# Patient Record
Sex: Female | Born: 1985 | Race: Black or African American | Hispanic: No | Marital: Married | Smoking: Never smoker
Health system: Southern US, Community
[De-identification: ages and names within clinical notes are randomized; demographics above are authoritative.]

## PROBLEM LIST (undated history)

## (undated) ENCOUNTER — Inpatient Hospital Stay (HOSPITAL_COMMUNITY): Payer: Self-pay

## (undated) DIAGNOSIS — A599 Trichomoniasis, unspecified: Secondary | ICD-10-CM

## (undated) DIAGNOSIS — D219 Benign neoplasm of connective and other soft tissue, unspecified: Secondary | ICD-10-CM

## (undated) DIAGNOSIS — L309 Dermatitis, unspecified: Secondary | ICD-10-CM

## (undated) DIAGNOSIS — N83299 Other ovarian cyst, unspecified side: Secondary | ICD-10-CM

## (undated) DIAGNOSIS — D649 Anemia, unspecified: Secondary | ICD-10-CM

## (undated) DIAGNOSIS — N809 Endometriosis, unspecified: Secondary | ICD-10-CM

---

## 2007-11-13 HISTORY — PX: DENTAL SURGERY: SHX609

## 2015-12-23 ENCOUNTER — Encounter (HOSPITAL_COMMUNITY): Payer: Self-pay

## 2015-12-23 ENCOUNTER — Inpatient Hospital Stay (HOSPITAL_COMMUNITY)
Admission: EM | Admit: 2015-12-23 | Discharge: 2015-12-26 | DRG: 759 | Disposition: A | Discharge status: Home / Self Care | Payer: BC Managed Care – PPO | Attending: Obstetrics and Gynecology | Admitting: Obstetrics and Gynecology

## 2015-12-23 ENCOUNTER — Emergency Department (HOSPITAL_COMMUNITY): Payer: BC Managed Care – PPO

## 2015-12-23 DIAGNOSIS — N7093 Salpingitis and oophoritis, unspecified: Principal | ICD-10-CM | POA: Diagnosis present

## 2015-12-23 DIAGNOSIS — N83201 Unspecified ovarian cyst, right side: Secondary | ICD-10-CM | POA: Diagnosis present

## 2015-12-23 DIAGNOSIS — R1032 Left lower quadrant pain: Secondary | ICD-10-CM | POA: Diagnosis not present

## 2015-12-23 DIAGNOSIS — D649 Anemia, unspecified: Secondary | ICD-10-CM | POA: Diagnosis present

## 2015-12-23 DIAGNOSIS — D252 Subserosal leiomyoma of uterus: Secondary | ICD-10-CM | POA: Diagnosis present

## 2015-12-23 LAB — CBC WITH DIFFERENTIAL/PLATELET
Basophils Absolute: 0 10*3/uL (ref 0.0–0.1)
Basophils Relative: 0 %
Eosinophils Absolute: 0.1 10*3/uL (ref 0.0–0.7)
Eosinophils Relative: 0 %
HEMATOCRIT: 35.7 % — AB (ref 36.0–46.0)
HEMOGLOBIN: 12.2 g/dL (ref 12.0–15.0)
LYMPHS ABS: 2.7 10*3/uL (ref 0.7–4.0)
LYMPHS PCT: 14 %
MCH: 31.2 pg (ref 26.0–34.0)
MCHC: 34.2 g/dL (ref 30.0–36.0)
MCV: 91.3 fL (ref 78.0–100.0)
MONO ABS: 1.1 10*3/uL — AB (ref 0.1–1.0)
MONOS PCT: 6 %
NEUTROS ABS: 15.5 10*3/uL — AB (ref 1.7–7.7)
NEUTROS PCT: 80 %
Platelets: 307 10*3/uL (ref 150–400)
RBC: 3.91 MIL/uL (ref 3.87–5.11)
RDW: 12.4 % (ref 11.5–15.5)
WBC: 19.3 10*3/uL — ABNORMAL HIGH (ref 4.0–10.5)

## 2015-12-23 LAB — COMPREHENSIVE METABOLIC PANEL
ALBUMIN: 4.7 g/dL (ref 3.5–5.0)
ALK PHOS: 60 U/L (ref 38–126)
ALT: 12 U/L — ABNORMAL LOW (ref 14–54)
ANION GAP: 11 (ref 5–15)
AST: 19 U/L (ref 15–41)
BUN: 12 mg/dL (ref 6–20)
CHLORIDE: 99 mmol/L — AB (ref 101–111)
CO2: 25 mmol/L (ref 22–32)
Calcium: 9.8 mg/dL (ref 8.9–10.3)
Creatinine, Ser: 0.86 mg/dL (ref 0.44–1.00)
GFR calc non Af Amer: >60 mL/min (ref >60)
GLUCOSE: 91 mg/dL (ref 65–99)
POTASSIUM: 3.7 mmol/L (ref 3.5–5.1)
SODIUM: 135 mmol/L (ref 135–145)
Total Bilirubin: 1 mg/dL (ref 0.3–1.2)
Total Protein: 9.4 g/dL — ABNORMAL HIGH (ref 6.5–8.1)

## 2015-12-23 LAB — WET PREP, GENITAL
SPERM: NONE SEEN
TRICH WET PREP: NONE SEEN

## 2015-12-23 MED ORDER — SODIUM CHLORIDE 0.9 % IV BOLUS (SEPSIS)
1000.0000 mL | Freq: Once | INTRAVENOUS | Status: AC
  Administered 2015-12-23: 1000 mL via INTRAVENOUS

## 2015-12-23 MED ORDER — HYDROCODONE-ACETAMINOPHEN 5-325 MG PO TABS
1.0000 | ORAL_TABLET | Freq: Once | ORAL | Status: AC
  Administered 2015-12-23: 1 via ORAL
  Filled 2015-12-23: qty 1

## 2015-12-23 MED ORDER — IOHEXOL 300 MG/ML  SOLN
100.0000 mL | Freq: Once | INTRAMUSCULAR | Status: AC | PRN
  Administered 2015-12-23: 100 mL via INTRAVENOUS

## 2015-12-23 NOTE — ED Provider Notes (Signed)
CSN: [id number]     Arrival date & time 12/23/15  1524 History   First MD Initiated Contact with Patient 12/23/15 1657     Chief Complaint  Patient presents with  . Abdominal Pain     (Consider location/radiation/quality/duration/timing/severity/associated sxs/prior Treatment) Patient is a 30 y.o. female presenting with abdominal pain. The history is provided by the patient and medical records. No language interpreter was used.  Abdominal Pain Associated symptoms: vaginal bleeding (On menstrual cycle)   Associated symptoms: no chills, no constipation, no cough, no diarrhea, no dysuria, no fever, no nausea, no shortness of breath, no sore throat, no vaginal discharge and no vomiting    Ebony Valentine is a 30 y.o. female  with no pertinent PMH who was sent by urgent care to the Emergency Department complaining of throbbing, intermittent left lower abdominal pain since yesterday at 2 pm. Pain has remained the same over this time period. Aleve and ibuprofen taken with little relief. Worsens with food or drink, therefore patient has had decreased PO intake. Pt. Having daily BM's. On last day of menstrual cycle - states this pain is much different from her typical menstrual cramps. Patient denies n/v/d/constipation, no vaginal pain/discharge, no dysuria, no urinary frequency.   History reviewed. No pertinent past medical history. Past Surgical History  Procedure Laterality Date  . Dental surgery     Family History  Problem Relation Age of Onset  . Diabetes Father    Social History  Substance Use Topics  . Smoking status: Never Smoker   . Smokeless tobacco: Never Used  . Alcohol Use: Yes     Comment: wine once a wek   OB History    No data available     Review of Systems  Constitutional: Negative for fever and chills.  HENT: Negative for congestion and sore throat.   Eyes: Negative for visual disturbance.  Respiratory: Negative for cough and shortness of breath.   Cardiovascular:  Negative.   Gastrointestinal: Positive for abdominal pain. Negative for nausea, vomiting, diarrhea, constipation and blood in stool.  Genitourinary: Positive for vaginal bleeding (On menstrual cycle). Negative for dysuria, frequency, vaginal discharge and vaginal pain.  Musculoskeletal: Negative for back pain and neck pain.  Skin: Negative for rash.  Neurological: Negative for dizziness, weakness and headaches.      Allergies  Shellfish allergy  Home Medications   Prior to Admission medications   Medication Sig Start Date End Date Taking? Authorizing Provider  Cyanocobalamin (VITAMIN B12 PO) Place 1 tablet under the tongue daily.   Yes Historical Provider, MD  diphenhydrAMINE (BENADRYL) 25 MG tablet Take 25-50 mg by mouth daily as needed for allergies.   Yes Historical Provider, MD  Multiple Vitamin (MULTIVITAMIN WITH MINERALS) TABS tablet Take 2 tablets by mouth daily.   Yes Historical Provider, MD  naproxen sodium (ANAPROX) 220 MG tablet Take 440 mg by mouth 2 (two) times daily as needed (pain).   Yes Historical Provider, MD   BP 107/69 mmHg  Pulse 88  Temp(Src) 99.6 F (37.6 C) (Oral)  Resp 17  SpO2 100%  LMP 12/23/2015 Physical Exam  Constitutional: She is oriented to person, place, and time. She appears well-developed and well-nourished.  Alert and in no acute distress  HENT:  Head: Normocephalic and atraumatic.  Tachy mucus membranes. OP clear.   Cardiovascular: Normal rate, regular rhythm and normal heart sounds.   Pulmonary/Chest: Effort normal and breath sounds normal. No respiratory distress. She has no wheezes. She has no rales.  Abdominal: Soft. Bowel sounds are normal. She exhibits distension. She exhibits no mass. There is no rebound and no guarding.  Generalized abdominal tenderness, worst in LLQ.   Genitourinary: Vagina normal.  Mild left adnexal tenderness. No discharge.   Musculoskeletal: Normal range of motion. She exhibits no edema.  Neurological: She is  alert and oriented to person, place, and time.  Skin: Skin is warm and dry. No rash noted.  Psychiatric: She has a normal mood and affect. Her behavior is normal. Judgment and thought content normal.  Nursing note and vitals reviewed.   ED Course  Procedures (including critical care time) Labs Review Labs Reviewed  WET PREP, GENITAL - Abnormal; Notable for the following:    Yeast Wet Prep HPF POC RARE (*)    Clue Cells Wet Prep HPF POC FEW (*)    WBC, Wet Prep HPF POC FEW (*)    All other components within normal limits  COMPREHENSIVE METABOLIC PANEL - Abnormal; Notable for the following:    Chloride 99 (*)    Total Protein 9.4 (*)    ALT 12 (*)    All other components within normal limits  CBC WITH DIFFERENTIAL/PLATELET - Abnormal; Notable for the following:    WBC 19.3 (*)    HCT 35.7 (*)    Neutro Abs 15.5 (*)    Monocytes Absolute 1.1 (*)    All other components within normal limits  URINE CULTURE  PREGNANCY, URINE  URINALYSIS, ROUTINE W REFLEX MICROSCOPIC (NOT AT [facility])  GC/CHLAMYDIA PROBE AMP ([place]) NOT AT [facility]    Imaging Review US Transvaginal Non-ob  12/23/2015  CLINICAL DATA:  Acute onset of left lower quadrant abdominal pain and leukocytosis. Initial encounter. EXAM: TRANSABDOMINAL AND TRANSVAGINAL ULTRASOUND OF PELVIS TECHNIQUE: Both transabdominal and transvaginal ultrasound examinations of the pelvis were performed. Transabdominal technique was performed for global imaging of the pelvis including uterus, ovaries, adnexal regions, and pelvic cul-de-sac. It was necessary to proceed with endovaginal exam following the transabdominal exam to visualize the endometrium and ovaries in greater detail. COMPARISON:  CT the abdomen and pelvis performed earlier today at 7:50 p.m. FINDINGS: Uterus Measurements: 4.9 x 3.8 x 4.4 cm. There appears to be a subserosal 3.1 cm fibroid. Endometrium Thickness: 0.3 cm.  No focal abnormality visualized. Right ovary Measurements: 4.5 x  3.4 x 3.3 cm. A nonspecific mildly complex cystic focus is noted within the right ovary, measuring 1.9 cm. This may remain within normal limits. Left ovary Measurements: 3.5 x 3.1 x 3.0 cm. There is a large complex loculated left adnexal fluid containing structure, measuring 8.1 x 7.6 x 4.9 cm, with displacement of the uterus and right ovary. Surrounding mildly increased blood flow is noted. This is suspicious for pyosalpinx, though tubo-ovarian abscess cannot be entirely excluded. Other findings A small amount of free fluid is seen within the pelvic cul-de-sac. IMPRESSION: 1. Large complex loculated left adnexal fluid containing structure, measuring 8.1 x 7.6 x 4.9 cm, with displacement of the uterus and right ovary. This corresponds to the patient's left-sided symptoms and is suspicious for left-sided pyosalpinx, though a tubo-ovarian abscess cannot be entirely excluded. Surrounding hyperemia noted. 2. Nonspecific mildly complex 1.9 cm cystic focus within the right ovary may remain within normal limits. 3. Subserosal 3.1 cm fibroid. Uterus otherwise grossly unremarkable. 4. No evidence for ovarian torsion. Visualized left ovarian tissue is unremarkable. These results were called by telephone at the time of interpretation on 12/23/2015 at 11:39 pm to [facility] PA, who verbally  acknowledged these results. Electronically Signed   By: Garald Balding M.D.   On: 12/23/2015 23:41   US Pelvis Complete  12/23/2015  CLINICAL DATA:  Acute onset of left lower quadrant abdominal pain and leukocytosis. Initial encounter. EXAM: TRANSABDOMINAL AND TRANSVAGINAL ULTRASOUND OF PELVIS TECHNIQUE: Both transabdominal and transvaginal ultrasound examinations of the pelvis were performed. Transabdominal technique was performed for global imaging of the pelvis including uterus, ovaries, adnexal regions, and pelvic cul-de-sac. It was necessary to proceed with endovaginal exam following the transabdominal exam to visualize the  endometrium and ovaries in greater detail. COMPARISON:  CT the abdomen and pelvis performed earlier today at 7:50 p.m. FINDINGS: Uterus Measurements: 4.9 x 3.8 x 4.4 cm. There appears to be a subserosal 3.1 cm fibroid. Endometrium Thickness: 0.3 cm.  No focal abnormality visualized. Right ovary Measurements: 4.5 x 3.4 x 3.3 cm. A nonspecific mildly complex cystic focus is noted within the right ovary, measuring 1.9 cm. This may remain within normal limits. Left ovary Measurements: 3.5 x 3.1 x 3.0 cm. There is a large complex loculated left adnexal fluid containing structure, measuring 8.1 x 7.6 x 4.9 cm, with displacement of the uterus and right ovary. Surrounding mildly increased blood flow is noted. This is suspicious for pyosalpinx, though tubo-ovarian abscess cannot be entirely excluded. Other findings A small amount of free fluid is seen within the pelvic cul-de-sac. IMPRESSION: 1. Large complex loculated left adnexal fluid containing structure, measuring 8.1 x 7.6 x 4.9 cm, with displacement of the uterus and right ovary. This corresponds to the patient's left-sided symptoms and is suspicious for left-sided pyosalpinx, though a tubo-ovarian abscess cannot be entirely excluded. Surrounding hyperemia noted. 2. Nonspecific mildly complex 1.9 cm cystic focus within the right ovary may remain within normal limits. 3. Subserosal 3.1 cm fibroid. Uterus otherwise grossly unremarkable. 4. No evidence for ovarian torsion. Visualized left ovarian tissue is unremarkable. These results were called by telephone at the time of interpretation on 12/23/2015 at 11:39 pm to [facility] PA, who verbally acknowledged these results. Electronically Signed   By: Garald Balding M.D.   On: 12/23/2015 23:41   Ct Abdomen Pelvis W Contrast  12/23/2015  CLINICAL DATA:  30 year old female with left lower quadrant abdominal pain EXAM: CT ABDOMEN AND PELVIS WITH CONTRAST TECHNIQUE: Multidetector CT imaging of the abdomen and pelvis was  performed using the standard protocol following bolus administration of intravenous contrast. CONTRAST:  155mL OMNIPAQUE IOHEXOL 300 MG/ML  SOLN COMPARISON:  None. FINDINGS: The visualized lung bases are clear. No intra-abdominal free air. Small free fluid within the pelvis. The liver, gallbladder, pancreas, spleen, adrenal glands kidneys, visualized ureters, and urinary bladder appear unremarkable. There is a 3.1 x 3.1 cm uterine fundal fibroid. There are dilated tubular appearing structures in the region of the left adnexa as well as in the posterior pelvis/ cul-de-sac compatible with bilateral hydrosalpinx, and left greater right. The left adnexal tube measure up to 4.7 cm in diameter. Clinical correlation is recommended to evaluate for pelvic inflammatory disease. Further evaluation with pelvic ultrasound is recommended. There is mild apparent thickening and inflammatory changes of the distal small bowel loop, likely secondary to inflammatory changes of the pelvis. Oral contrast traverses into the colon without evidence of bowel obstruction. Normal appendix. The abdominal aorta and IVC appear unremarkable. No portal venous gas identified. There is no adenopathy. Small fat containing umbilical hernia. The osseous structures appear unremarkable. IMPRESSION: Bilateral hydrosalpinx, left greater than right. Correlation with clinical exam is recommended to evaluate  for pelvic inflammatory disease. Ultrasound recommended for further evaluation. Thickened appearing distal small bowel, likely reactive to inflammatory changes of the pelvis. Enteritis is less likely. No bowel obstruction. Electronically Signed   By: Anner Crete M.D.   On: 12/23/2015 20:31   I have personally reviewed and evaluated these images and lab results as part of my medical decision-making.   EKG Interpretation None      MDM   Final diagnoses:  Left lower quadrant pain   Ashelly Zinsmeister presents with left lower quadrant pain.  TTP of bilateral lower abdomen, L>R. Pelvic with mild left-sided adnexal tenderness, no discharge.   Labs: CBC with white count of 19.3, ab neuto 15.5; CMP reassuring. Wet prep with few clue cells, few WBC, rare yeast.   Ultrasound shows large complex loculated left adnexal fluid containing structure suspicious for pyosalpinx, however cannot rule out TOA.   Consults: OBGYN, Dr. Nehemiah Settle who recommends starting ABX in ED and sending patient to [facility] for further evaluation and management  Therapeutics: Pain control while in ED, IV fluids  A&P: Patient to be transferred to MAU for further evaluation and management. Accepting physician Dr. Nehemiah Settle. Of note, UA and culture were ordered on this patient. I was informed after consultation that lab specimen had been lost. Patient to get UA at accepting facility.   Patient discussed with Dr. Zenia Resides who agrees with treatment plan.   [facility] Sorren Vallier, PA-C 12/24/15 0136  Lacretia Leigh, MD 12/25/15 [phone]

## 2015-12-23 NOTE — Progress Notes (Signed)
EDCM spoke to patient at bedside.  Patient reports she sees her obgyn Dr. Henry Russel.  Patient is without a pcp.  [facility] provided patient with list of pcps who accept [employer] insurance within a 10 mile radius of her zip code [phone].  Patient thankful for services.  No further EDCM needs at this time.

## 2015-12-23 NOTE — ED Notes (Addendum)
Patient c/o left lower abdominal pain since yesterday. Patient states afater she started her menstrual period , she experienced pain left lower abdomen. Patient denies N/VD, dysuria, or vaginal drainage. Patient went to UC today and was given Tylenol with some relief and sent for further evaluation.

## 2015-12-24 ENCOUNTER — Encounter (HOSPITAL_COMMUNITY): Payer: Self-pay

## 2015-12-24 DIAGNOSIS — N7093 Salpingitis and oophoritis, unspecified: Secondary | ICD-10-CM | POA: Diagnosis present

## 2015-12-24 DIAGNOSIS — D252 Subserosal leiomyoma of uterus: Secondary | ICD-10-CM | POA: Diagnosis present

## 2015-12-24 DIAGNOSIS — N83201 Unspecified ovarian cyst, right side: Secondary | ICD-10-CM | POA: Diagnosis present

## 2015-12-24 DIAGNOSIS — R1032 Left lower quadrant pain: Secondary | ICD-10-CM | POA: Diagnosis present

## 2015-12-24 DIAGNOSIS — D649 Anemia, unspecified: Secondary | ICD-10-CM | POA: Diagnosis present

## 2015-12-24 LAB — URINE MICROSCOPIC-ADD ON

## 2015-12-24 LAB — URINALYSIS, ROUTINE W REFLEX MICROSCOPIC
Bilirubin Urine: NEGATIVE
GLUCOSE, UA: NEGATIVE mg/dL
Ketones, ur: >80 mg/dL — AB
LEUKOCYTES UA: NEGATIVE
Nitrite: NEGATIVE
PROTEIN: NEGATIVE mg/dL
SPECIFIC GRAVITY, URINE: 1.025 (ref 1.005–1.030)
pH: 6 (ref 5.0–8.0)

## 2015-12-24 LAB — CBC
HEMATOCRIT: 29.7 % — AB (ref 36.0–46.0)
HEMOGLOBIN: 10 g/dL — AB (ref 12.0–15.0)
MCH: 30.6 pg (ref 26.0–34.0)
MCHC: 33.7 g/dL (ref 30.0–36.0)
MCV: 90.8 fL (ref 78.0–100.0)
Platelets: 250 10*3/uL (ref 150–400)
RBC: 3.27 MIL/uL — ABNORMAL LOW (ref 3.87–5.11)
RDW: 12.8 % (ref 11.5–15.5)
WBC: 15.4 10*3/uL — AB (ref 4.0–10.5)

## 2015-12-24 LAB — PREGNANCY, URINE: PREG TEST UR: NEGATIVE

## 2015-12-24 MED ORDER — PRENATAL MULTIVITAMIN CH
1.0000 | ORAL_TABLET | Freq: Every day | ORAL | Status: DC
  Administered 2015-12-24 – 2015-12-26 (×3): 1 via ORAL
  Filled 2015-12-24 (×3): qty 1

## 2015-12-24 MED ORDER — SODIUM CHLORIDE 0.9 % IV SOLN
INTRAVENOUS | Status: DC
  Administered 2015-12-24 – 2015-12-25 (×3): via INTRAVENOUS

## 2015-12-24 MED ORDER — CALCIUM CARBONATE ANTACID 500 MG PO CHEW: 2.0000 | CHEWABLE_TABLET | ORAL | Status: DC | PRN

## 2015-12-24 MED ORDER — ALUM & MAG HYDROXIDE-SIMETH 200-200-20 MG/5ML PO SUSP: 30.0000 mL | Freq: Four times a day (QID) | ORAL | Status: DC | PRN

## 2015-12-24 MED ORDER — DOCUSATE SODIUM 100 MG PO CAPS
100.0000 mg | ORAL_CAPSULE | Freq: Every day | ORAL | Status: DC
  Administered 2015-12-24 – 2015-12-26 (×3): 100 mg via ORAL
  Filled 2015-12-24 (×3): qty 1

## 2015-12-24 MED ORDER — ONDANSETRON HCL 4 MG/2ML IJ SOLN
4.0000 mg | Freq: Four times a day (QID) | INTRAMUSCULAR | Status: DC | PRN
Start: 2015-12-24 — End: 2015-12-26

## 2015-12-24 MED ORDER — ACETAMINOPHEN 325 MG PO TABS: 650.0000 mg | ORAL_TABLET | Freq: Four times a day (QID) | ORAL | Status: DC | PRN

## 2015-12-24 MED ORDER — ACETAMINOPHEN 650 MG RE SUPP: 650.0000 mg | Freq: Four times a day (QID) | RECTAL | Status: DC | PRN

## 2015-12-24 MED ORDER — DEXTROSE 5 % IV SOLN
2.0000 g | Freq: Two times a day (BID) | INTRAVENOUS | Status: DC
  Administered 2015-12-24: 2 g via INTRAVENOUS
  Filled 2015-12-24 (×2): qty 2

## 2015-12-24 MED ORDER — DOXYCYCLINE HYCLATE 100 MG IV SOLR
100.0000 mg | Freq: Two times a day (BID) | INTRAVENOUS | Status: DC
  Administered 2015-12-24: 100 mg via INTRAVENOUS
  Filled 2015-12-24 (×2): qty 100

## 2015-12-24 MED ORDER — DOXYCYCLINE HYCLATE 100 MG IV SOLR
100.0000 mg | Freq: Two times a day (BID) | INTRAVENOUS | Status: DC
  Administered 2015-12-24 – 2015-12-26 (×5): 100 mg via INTRAVENOUS
  Filled 2015-12-24 (×5): qty 100

## 2015-12-24 MED ORDER — SODIUM CHLORIDE 0.9 % IV SOLN
INTRAVENOUS | Status: DC
  Administered 2015-12-24: 03:00:00 via INTRAVENOUS

## 2015-12-24 MED ORDER — NAPROXEN SODIUM 275 MG PO TABS
440.0000 mg | ORAL_TABLET | Freq: Two times a day (BID) | ORAL | Status: DC | PRN
  Filled 2015-12-24: qty 2

## 2015-12-24 MED ORDER — ACETAMINOPHEN 325 MG PO TABS: 650.0000 mg | ORAL_TABLET | ORAL | Status: DC | PRN

## 2015-12-24 MED ORDER — DEXTROSE 5 % IV SOLN
2.0000 g | Freq: Two times a day (BID) | INTRAVENOUS | Status: DC
  Administered 2015-12-24 – 2015-12-25 (×4): 2 g via INTRAVENOUS
  Filled 2015-12-24 (×5): qty 2

## 2015-12-24 MED ORDER — HYDROCODONE-ACETAMINOPHEN 5-325 MG PO TABS
1.0000 | ORAL_TABLET | ORAL | Status: DC | PRN
  Administered 2015-12-24 – 2015-12-26 (×5): 1 via ORAL
  Filled 2015-12-24 (×5): qty 1

## 2015-12-24 NOTE — Progress Notes (Signed)
TOA  Day# 1 cefotetan / doxycycline #0  Subjective:  Has remained afebrile Patient reports that pain is well managed.  Tolerating normal  diet without difficulty. No nausea / vomiting.  Ambulating and voiding. Passing flatus  Objective: BP 102/59 mmHg  Pulse 86  Temp(Src) 99.1 F (37.3 C) (Oral)  Resp 18  Ht 5\' 6"  (1.676 m)  Wt 153 lb (69.4 kg)  BMI 24.71 kg/m2  SpO2 100%  LMP 12/23/2015 Abdomen:slightly distended with mild guarding and tenderness LLQ. No rebound Extremities: Homans sign is negative, no sign of DVT   WBC down to 15.4 from 19.2  Assessment: 8 cm TOA   Plan: IV antibiotics for 48 hours and then consider outpatient management  LOS: 0 days    Lillard Bailon A 12/24/2015, 2:06 PM

## 2015-12-24 NOTE — H&P (Signed)
Faculty Practice History and Physical  Ebony Valentine [id number] DOB: [birth year] DOA: 12/23/2015  Chief Complaint: Left lower quadrant pain  HPI: Ebony Valentine is a 30 y.o. female with a non-complicated medical history who presents to the [facility] emergency department with severe, nonradiating left-sided lower quadrant pain that started 3 days ago and has gradually become worse. Worse with movement and improved with rest. Currently, after Vicodin, her pain is 2 out of 10. The patient regularly sees [facility] for obstetrical care   Review of Systems:   Pt complains of cold chills last night and moderate decrease in appetite.  Pt denies any fevers, nausea, vomiting, chest pain, shortness of breath, orthopnea, dyspnea on exertion, headache, blurred vision, vaginal bleeding, vaginal discharge, dysuria, frequency. Review of systems are otherwise negative  Prenatal History/Complications: None  Past Medical History: History reviewed. No pertinent past medical history.  Past Surgical History: Past Surgical History  Procedure Laterality Date  . Dental surgery      Obstetrical History: OB History    No data available      Social History: Social History   Social History  . Marital Status: Married    Spouse Name: N/A  . Number of Children: N/A  . Years of Education: N/A   Social History Main Topics  . Smoking status: Never Smoker   . Smokeless tobacco: Never Used  . Alcohol Use: Yes     Comment: wine once a wek  . Drug Use: No  . Sexual Activity: Not Asked   Other Topics Concern  . None   Social History Narrative    Family History: Family History  Problem Relation Age of Onset  . Diabetes Father     Allergies: Allergies  Allergen Reactions  . Shellfish Allergy Anaphylaxis    Prescriptions prior to admission  Medication Sig Dispense Refill Last Dose  . Cyanocobalamin (VITAMIN B12 PO) Place 1 tablet under the tongue daily.   Past Week at  Unknown time  . diphenhydrAMINE (BENADRYL) 25 MG tablet Take 25-50 mg by mouth daily as needed for allergies.   Past Month at Unknown time  . Multiple Vitamin (MULTIVITAMIN WITH MINERALS) TABS tablet Take 2 tablets by mouth daily.   12/23/2015 at Unknown time  . naproxen sodium (ANAPROX) 220 MG tablet Take 440 mg by mouth 2 (two) times daily as needed (pain).   Past Week at Unknown time    Physical Exam: BP 107/56 mmHg  Pulse 85  Temp(Src) 98.3 F (36.8 C) (Oral)  Resp 18  Ht 5\' 6"  (1.676 m)  Wt 153 lb (69.4 kg)  BMI 24.71 kg/m2  SpO2 100%  LMP 12/23/2015  BP 107/56 mmHg  Pulse 85  Temp(Src) 98.3 F (36.8 C) (Oral)  Resp 18  Ht 5\' 6"  (1.676 m)  Wt 153 lb (69.4 kg)  BMI 24.71 kg/m2  SpO2 100%  LMP 12/23/2015 General appearance: alert, cooperative and no distress Head: Normocephalic, without obvious abnormality, atraumatic Eyes: conjunctivae/corneas clear. PERRL, EOM's intact. Fundi benign. Ears: External ears normal bilaterally Neck: no adenopathy, no carotid bruit, no JVD, supple, symmetrical, trachea midline and thyroid not enlarged, symmetric, no tenderness/mass/nodules Lungs: clear to auscultation bilaterally Heart: regular rate and rhythm, S1, S2 normal, no murmur, click, rub or gallop Abdomen: Soft, tender in the lower quadrants left greater than right with mild guarding. No rebound tenderness. Hypoactive bowel sounds. Extremities: extremities normal, atraumatic, no cyanosis or edema Pulses: 2+ and symmetric Skin: Skin color, texture, turgor normal. No rashes or lesions  Neurologic: Alert and oriented X 3, normal strength and tone. Normal symmetric reflexes. Normal coordination and gait             Labs on Admission:  Basic Metabolic Panel:  Recent Labs Lab 12/23/15 1728  NA 135  K 3.7  CL 99*  CO2 25  GLUCOSE 91  BUN 12  CREATININE 0.86  CALCIUM 9.8   Liver Function Tests:  Recent Labs Lab 12/23/15 1728  AST 19  ALT 12*  ALKPHOS 60  BILITOT 1.0   PROT 9.4*  ALBUMIN 4.7   No results for input(s): LIPASE, AMYLASE in the last 168 hours. No results for input(s): AMMONIA in the last 168 hours. CBC:  Recent Labs Lab 12/23/15 1728 12/24/15 0538  WBC 19.3* 15.4*  NEUTROABS 15.5*  --   HGB 12.2 10.0*  HCT 35.7* 29.7*  MCV 91.3 90.8  PLT 307 250    CBG: No results for input(s): GLUCAP in the last 168 hours.  Radiological Exams on Admission: US Transvaginal Non-ob  12/23/2015  CLINICAL DATA:  Acute onset of left lower quadrant abdominal pain and leukocytosis. Initial encounter. EXAM: TRANSABDOMINAL AND TRANSVAGINAL ULTRASOUND OF PELVIS TECHNIQUE: Both transabdominal and transvaginal ultrasound examinations of the pelvis were performed. Transabdominal technique was performed for global imaging of the pelvis including uterus, ovaries, adnexal regions, and pelvic cul-de-sac. It was necessary to proceed with endovaginal exam following the transabdominal exam to visualize the endometrium and ovaries in greater detail. COMPARISON:  CT the abdomen and pelvis performed earlier today at 7:50 p.m. FINDINGS: Uterus Measurements: 4.9 x 3.8 x 4.4 cm. There appears to be a subserosal 3.1 cm fibroid. Endometrium Thickness: 0.3 cm.  No focal abnormality visualized. Right ovary Measurements: 4.5 x 3.4 x 3.3 cm. A nonspecific mildly complex cystic focus is noted within the right ovary, measuring 1.9 cm. This may remain within normal limits. Left ovary Measurements: 3.5 x 3.1 x 3.0 cm. There is a large complex loculated left adnexal fluid containing structure, measuring 8.1 x 7.6 x 4.9 cm, with displacement of the uterus and right ovary. Surrounding mildly increased blood flow is noted. This is suspicious for pyosalpinx, though tubo-ovarian abscess cannot be entirely excluded. Other findings A small amount of free fluid is seen within the pelvic cul-de-sac. IMPRESSION: 1. Large complex loculated left adnexal fluid containing structure, measuring 8.1 x 7.6 x 4.9  cm, with displacement of the uterus and right ovary. This corresponds to the patient's left-sided symptoms and is suspicious for left-sided pyosalpinx, though a tubo-ovarian abscess cannot be entirely excluded. Surrounding hyperemia noted. 2. Nonspecific mildly complex 1.9 cm cystic focus within the right ovary may remain within normal limits. 3. Subserosal 3.1 cm fibroid. Uterus otherwise grossly unremarkable. 4. No evidence for ovarian torsion. Visualized left ovarian tissue is unremarkable. These results were called by telephone at the time of interpretation on 12/23/2015 at 11:39 pm to [facility] PA, who verbally acknowledged these results. Electronically Signed   By: Garald Balding M.D.   On: 12/23/2015 23:41   US Pelvis Complete  12/23/2015  CLINICAL DATA:  Acute onset of left lower quadrant abdominal pain and leukocytosis. Initial encounter. EXAM: TRANSABDOMINAL AND TRANSVAGINAL ULTRASOUND OF PELVIS TECHNIQUE: Both transabdominal and transvaginal ultrasound examinations of the pelvis were performed. Transabdominal technique was performed for global imaging of the pelvis including uterus, ovaries, adnexal regions, and pelvic cul-de-sac. It was necessary to proceed with endovaginal exam following the transabdominal exam to visualize the endometrium and ovaries in greater detail. COMPARISON:  CT the abdomen and pelvis performed earlier today at 7:50 p.m. FINDINGS: Uterus Measurements: 4.9 x 3.8 x 4.4 cm. There appears to be a subserosal 3.1 cm fibroid. Endometrium Thickness: 0.3 cm.  No focal abnormality visualized. Right ovary Measurements: 4.5 x 3.4 x 3.3 cm. A nonspecific mildly complex cystic focus is noted within the right ovary, measuring 1.9 cm. This may remain within normal limits. Left ovary Measurements: 3.5 x 3.1 x 3.0 cm. There is a large complex loculated left adnexal fluid containing structure, measuring 8.1 x 7.6 x 4.9 cm, with displacement of the uterus and right ovary. Surrounding mildly  increased blood flow is noted. This is suspicious for pyosalpinx, though tubo-ovarian abscess cannot be entirely excluded. Other findings A small amount of free fluid is seen within the pelvic cul-de-sac. IMPRESSION: 1. Large complex loculated left adnexal fluid containing structure, measuring 8.1 x 7.6 x 4.9 cm, with displacement of the uterus and right ovary. This corresponds to the patient's left-sided symptoms and is suspicious for left-sided pyosalpinx, though a tubo-ovarian abscess cannot be entirely excluded. Surrounding hyperemia noted. 2. Nonspecific mildly complex 1.9 cm cystic focus within the right ovary may remain within normal limits. 3. Subserosal 3.1 cm fibroid. Uterus otherwise grossly unremarkable. 4. No evidence for ovarian torsion. Visualized left ovarian tissue is unremarkable. These results were called by telephone at the time of interpretation on 12/23/2015 at 11:39 pm to [facility] PA, who verbally acknowledged these results. Electronically Signed   By: Garald Balding M.D.   On: 12/23/2015 23:41   Ct Abdomen Pelvis W Contrast  12/23/2015  CLINICAL DATA:  30 year old female with left lower quadrant abdominal pain EXAM: CT ABDOMEN AND PELVIS WITH CONTRAST TECHNIQUE: Multidetector CT imaging of the abdomen and pelvis was performed using the standard protocol following bolus administration of intravenous contrast. CONTRAST:  168mL OMNIPAQUE IOHEXOL 300 MG/ML  SOLN COMPARISON:  None. FINDINGS: The visualized lung bases are clear. No intra-abdominal free air. Small free fluid within the pelvis. The liver, gallbladder, pancreas, spleen, adrenal glands kidneys, visualized ureters, and urinary bladder appear unremarkable. There is a 3.1 x 3.1 cm uterine fundal fibroid. There are dilated tubular appearing structures in the region of the left adnexa as well as in the posterior pelvis/ cul-de-sac compatible with bilateral hydrosalpinx, and left greater right. The left adnexal tube measure up to 4.7 cm  in diameter. Clinical correlation is recommended to evaluate for pelvic inflammatory disease. Further evaluation with pelvic ultrasound is recommended. There is mild apparent thickening and inflammatory changes of the distal small bowel loop, likely secondary to inflammatory changes of the pelvis. Oral contrast traverses into the colon without evidence of bowel obstruction. Normal appendix. The abdominal aorta and IVC appear unremarkable. No portal venous gas identified. There is no adenopathy. Small fat containing umbilical hernia. The osseous structures appear unremarkable. IMPRESSION: Bilateral hydrosalpinx, left greater than right. Correlation with clinical exam is recommended to evaluate for pelvic inflammatory disease. Ultrasound recommended for further evaluation. Thickened appearing distal small bowel, likely reactive to inflammatory changes of the pelvis. Enteritis is less likely. No bowel obstruction. Electronically Signed   By: Anner Crete M.D.   On: 12/23/2015 20:31     Assessment/Plan Present on Admission:  . TOA (tubo-ovarian abscess)  #1 tubo-ovarian abscess versus pyosalpinx  Admit  Cefotetan and doxycycline  Repeat white count this morning shows slight improvement. Will repeat tomorrow and trend.  Maternal remainder care to [facility]  Code Status: Full code  Disposition Plan: Estimated 2-3 day stay  for IV antibiotics   Truett Mainland, DO 12/24/2015 6:42 AM Faculty Practice Attending Physician [facility] Attending Phone #: [phone]

## 2015-12-25 LAB — CBC WITH DIFFERENTIAL/PLATELET
BASOS ABS: 0 10*3/uL (ref 0.0–0.1)
Basophils Relative: 0 %
Eosinophils Absolute: 0.3 10*3/uL (ref 0.0–0.7)
Eosinophils Relative: 3 %
HEMATOCRIT: 27.4 % — AB (ref 36.0–46.0)
Hemoglobin: 9.4 g/dL — ABNORMAL LOW (ref 12.0–15.0)
LYMPHS ABS: 2.3 10*3/uL (ref 0.7–4.0)
LYMPHS PCT: 22 %
MCH: 30.9 pg (ref 26.0–34.0)
MCHC: 34.3 g/dL (ref 30.0–36.0)
MCV: 90.1 fL (ref 78.0–100.0)
Monocytes Absolute: 0.4 10*3/uL (ref 0.1–1.0)
Monocytes Relative: 4 %
NEUTROS ABS: 7.6 10*3/uL (ref 1.7–7.7)
Neutrophils Relative %: 71 %
Platelets: 265 10*3/uL (ref 150–400)
RBC: 3.04 MIL/uL — AB (ref 3.87–5.11)
RDW: 12.8 % (ref 11.5–15.5)
WBC: 10.6 10*3/uL — AB (ref 4.0–10.5)

## 2015-12-25 LAB — URINE CULTURE

## 2015-12-25 NOTE — Progress Notes (Signed)
*   No surgery found *  Subjective: Patient reports tolerating PO, + flatus and + BM.    Objective: I have reviewed patient's vital signs and medications.  General: alert and cooperative Resp: clear to auscultation bilaterally Cardio: regular rate and rhythm, S1, S2 normal, no murmur, click, rub or gallop GI: soft, non-tender; bowel sounds normal; no masses,  no organomegaly Extremities: extremities normal, atraumatic, no cyanosis or edema Vaginal Bleeding: none  Assessment: Pt with TOA possible endometrioma  Plan: Continue abx  Pt takes pain meds prn  LOS: 1 day    Ebony Valentine A 12/25/2015, 1:49 PM

## 2015-12-26 LAB — GC/CHLAMYDIA PROBE AMP (~~LOC~~) NOT AT ARMC
Chlamydia: NEGATIVE
Neisseria Gonorrhea: NEGATIVE

## 2015-12-26 MED ORDER — TERCONAZOLE 0.4 % VA CREA: TOPICAL_CREAM | VAGINAL | Status: DC

## 2015-12-26 MED ORDER — DOXYCYCLINE HYCLATE 100 MG PO TABS: 100.0000 mg | ORAL_TABLET | Freq: Two times a day (BID) | ORAL | Status: DC

## 2015-12-26 MED ORDER — FLUCONAZOLE 150 MG PO TABS: 150.0000 mg | ORAL_TABLET | Freq: Once | ORAL | Status: DC

## 2015-12-26 MED ORDER — HYDROCODONE-ACETAMINOPHEN 5-325 MG PO TABS: 1.0000 | ORAL_TABLET | ORAL | Status: DC | PRN

## 2015-12-26 MED ORDER — CIPROFLOXACIN HCL 500 MG PO TABS: 500.0000 mg | ORAL_TABLET | Freq: Two times a day (BID) | ORAL | Status: DC

## 2015-12-26 MED ORDER — NAPROXEN SODIUM ER 500 MG PO TB24: ORAL_TABLET | ORAL | Status: DC

## 2015-12-26 NOTE — Discharge Instructions (Signed)
Call [facility] @ [phone] if:  You have a temperature greater than or equal to 100.4 degrees Farenheit orally You have pain that is not made better by the pain medication given and taken as directed You have excessive bleeding or problems urinating  Take Colace (Docusate Sodium/Stool Softener) 100 mg 2-3 times daily while taking narcotic pain medicine to avoid constipation or until bowel movements are regular. Take iron supplement of your choice twice a day for the next 12 weeks Take Naproxen Sodium 500 mg ER   twice a day  (at least 8 hours apart with food for pain  You may drive when you are no longer taking narcotic for pain You may walk up steps You may shower   You may resume a regular diet Avoid anything in vagina for 6 weeks (or until after your post-operative visit)

## 2015-12-26 NOTE — Discharge Summary (Signed)
Physician Discharge Summary  Patient ID: Ebony Valentine MRN: [id number] DOB/AGE: Nov 19, 1985 30 y.o.  Admit date: 12/23/2015 Discharge date: 12/26/2015   Discharge Diagnoses: Abdominal Pain, Tubo-ovarian Abscess, Right Ovarian Cyst and Uterine Fibroid Active Problems:   TOA (tubo-ovarian abscess)    Discharged Condition: Good  Hospital Course: The patient was  transferred from [facility] Emergency Department 12/24/15,  after being evaluated for left lower quadrant pelvic pain of 1 day duration.   Pelvic ultrasound revealed that the patient had a large complex loculated left adnexal fluid containing structure measuring 8.1 x 7.6 x 4.9 cm, a complex right ovarian cyst measuring 1.9 cm and a 3.1 cm subserosal fibroid.  Though afebrile throughout her visit her white blood count on admission to the ER was 19.3 but,  after  48 hours, it  decreased to 10.6.  Discharge hemoglobin/hematocrit were 9.4/27.4 respectively.  After 48 hours of IV cefotetan and doxycycline the patient  was virtually pain free, was ambulatory and had no bowel or bladder complaints.  Having received the maximum benefit of her hospital stay the patient was therefore deemed ready for discharge home.   Disposition:  Home to self care  Discharge Medications:    Medication List    STOP taking these medications        naproxen sodium 220 MG tablet  Commonly known as:  ANAPROX  Replaced by:  naproxen 500 MG 24 hr tablet      TAKE these medications        diphenhydrAMINE 25 MG tablet  Commonly known as:  BENADRYL  Take 25-50 mg by mouth daily as needed for allergies.     HYDROcodone-acetaminophen 5-325 MG tablet  Commonly known as:  NORCO/VICODIN  Take 1 tablet by mouth every 4 (four) hours as needed for moderate pain.     multivitamin with minerals Tabs tablet  Take 2 tablets by mouth daily.     naproxen 500 MG 24 hr tablet  Commonly known as:  NAPRELAN  1   po  pc bid  (at least 8 hours apart) x 3 days then prn-pain     VITAMIN B12 PO  Place 1 tablet under the tongue daily.          Follow-up: Dr. Katharine Look A. Rivard on January 17, 2016 at 11:15 a.m. (preceded by a follow up ultrasound at 11 a.m.)   Signed: Earnstine Regal, PA-C 12/26/2015, 8:52 AM

## 2015-12-26 NOTE — Progress Notes (Signed)
Patient discharged home with sister... Discharge instructions reviewed with patient and she verbalized understanding... Condition stable... No equipment... Ambulated to car with Santiago Bur, NT.

## 2015-12-26 NOTE — Progress Notes (Signed)
Ebony Valentine is a30 y.o.  [id number]  Hospital Day # 2:  TOA  Subjective: Patient is doing well with no complaints of pain.  Is voiding and having a bowel movement without difficulty. Patient has no problems ambulating in the halls and is tolerating a regular diet.   Objective: Vital signs in last 24 hours: Temp:  [98 F (36.7 C)-99.3 F (37.4 C)] 98.3 F (36.8 C) (02/13 0556) Pulse Rate:  [72-97] 72 (02/13 0556) Resp:  [16-18] 16 (02/13 0556) BP: (97-126)/(67-74) 97/67 mmHg (02/13 0556) SpO2:  [100 %] 100 % (02/13 0556)  Intake/Output from previous day: 02/12 0701 - 02/13 0700 In: 2393.3 [P.O.:220; I.V.:1623.3] Out: 2000 [Urine:2000] Intake/Output this shift:    Recent Labs Lab 12/23/15 1728 12/24/15 0538 12/25/15 0813  WBC 19.3* 15.4* 10.6*  HGB 12.2 10.0* 9.4*  HCT 35.7* 29.7* 27.4*  PLT 307 250 265     Recent Labs Lab 12/23/15 1728  NA 135  K 3.7  CL 99*  CO2 25  BUN 12  CREATININE 0.86  CALCIUM 9.8  PROT 9.4*  BILITOT 1.0  ALKPHOS 60  ALT 12*  AST 19  GLUCOSE 91    EXAM: General: alert, cooperative and no distress Resp: clear to auscultation bilaterally Cardio: regular rate and rhythm, S1, S2 normal, no murmur, click, rub or gallop GI: Bowel sounds present, non-tender. Extremities: Homans sign is negative, no sign of DVT and no calf tenderness.   Assessment: Tubo-ovarian Abscess : stable, progressing well and anemia  Plan: Routine Care  Per admission note the patient may be discharge after 48 hours on IV antibiotics for out patient management Per on-call physician  LOS: 2 days    Azra Abrell, PA-C 12/26/2015 8:12 AM

## 2016-06-15 ENCOUNTER — Other Ambulatory Visit: Payer: Self-pay | Admitting: Obstetrics and Gynecology

## 2016-06-19 NOTE — H&P (Signed)
Ebony Valentine is a 30 y.o.  female G:0 presents for removal of bilateral ovarian cysts that have been enlarging and persistent.  In February 2017 the patient was seen in the local Emergency Department for pelvic pain and leukocytosis.   During that evaluation,  a pelvic ultrasound showed:  uterus: 4.9 x 3.4 x 3.3 cm with a .1 cm sub-serosal fibroid;  right ovary: 4.5 x 3.4 x 3.3 cm with a complex cystic focus measuring 1.9 cm; left ovary: 3.5 x 3.1 x 3.0 cm with a large complex loculated adnexal fluid filled structure:  8.1 x 7.6 x 4.9 cm that displaced uterus and right ovary-surrounded by increased blood flow; a question of pyosalpinx vs tubo-ovarian abscess were considered; small free fluid was seen in cul-de-sac.  The patient's initial abdominal pain resolved leaving her with no further pain except for, increased cramping with her menses, rated 9/10 on a 10 point pain scale.  This cramping  is relieved with over the counter analgesia.    Her flow remained  stable for 4 days and pad change 5 times a day (primarily for hygiene).  She has not had any non-menstrual pelvic pain, dyspareunia, vaginitis symptoms or changes in bowel or bladder function.  A follow up ultrasound in June 2017 revealed:  right ovary: 10.36 x 8.59 x 6.55 cm containing #3 cysts.  Two of the cysts have the appearance of endometriomas measuring:  5.5 x 5.6 x 5.1 cm and 3..3 x 4.1 x 3.1 cm  and an apparent hemorrhagic  cyst: 2.3 x 3.8 x 3.1 cm.   Giving the persistent and expanding nature of these bilateral ovarian cysts the patient has consented to proceed with laparoscopic removal of them both.   Past Medical History  OB History: G:0  GYN History: menarche: 71    LMP: 05/30/2016    Contracepton Desires Pregnancy  The patient denies history of sexually transmitted disease.  Denies history of abnormal PAP smear.  Last PAP smear: 2016-normal  Medical History: Anemia, Uterine Fibroid and Eczema  Surgical History:  Dental Surgery   (wisdom teeth removal) Denies problems with anesthesia or history of blood transfusions  Family History:  Asthma,  Cancer, Hypertension, Anemia and Stroke  Social History:  Married (new name-Ebony Valentine);  Employed as a Pharmacist, hospital;  Denies tobacco use but occasionally uses alcohol   Medications:   Naproxen 500 mg  twice a day prn  Allergies  Allergen Reactions  . Shellfish Allergy Anaphylaxis   Denies sensitivity to peanuts, soy, latex or adhesives.   ROS: Admits to glasses and occasional skin rashes due to eczema but  denies headache, vision changes, nasal congestion, dysphagia, tinnitus, dizziness, hoarseness, cough,  chest pain, shortness of breath, nausea, vomiting, diarrhea,constipation,  urinary frequency, urgency  dysuria, hematuria, vaginitis symptoms, swelling of joints,easy bruising,  myalgias, arthralgias, skin rashes, unexplained weight loss and except as is mentioned in the history of present illness, patient's review of systems is otherwise negative.    Physical Exam  Bp:  110/62    P: 64 bpm     R: 18    Temperature: 98.7 degrees F orally   Height: 5' 6.25 "    Weight: 155 lbs.   BMI: 24.8  Neck: supple without masses or thyromegaly Lungs: clear to auscultation Heart: regular rate and rhythm Abdomen: soft, non-tender and no organomegaly Pelvic:EGBUS- wnl; vagina-normal rugae; uterus-normal size (exam limited by patient discomfort and anxiety) , cervix without lesions or motion tenderness; adnexae-bilateral  tenderness  Extremities:  no  clubbing, cyanosis or edema   Assesment:  Bilateral Complex Ovarian Cysts   Disposition:  Reviewed with the patient,  the indications for her procedure along with the risks of surgery to include, but not limited to: reaction to anesthesia, damage to adjacent organs, infection, transient facial edema and  excessive bleeding.  A Miralax Bowel Prep was given to complete the day before her surgery. The patient verbalized understanding of these  risks and pre-op instructions  and has consented to proceed with a Robot Assisted Laparoscopic Bilateral Ovarian Cystectomy at [place] [facility] on 07/11/16.Marland Kitchen   CSN# [id number]   Ebony Valentine J. Florene Glen, PA-C  for Dr. Dede Query. Rivard

## 2016-06-27 ENCOUNTER — Encounter (HOSPITAL_COMMUNITY): Payer: Self-pay

## 2016-06-27 ENCOUNTER — Encounter (HOSPITAL_COMMUNITY)
Admission: RE | Admit: 2016-06-27 | Discharge: 2016-06-27 | Disposition: A | Discharge status: Home / Self Care | Payer: BC Managed Care – PPO | Source: Ambulatory Visit | Attending: Obstetrics and Gynecology | Admitting: Obstetrics and Gynecology

## 2016-06-27 DIAGNOSIS — Z01818 Encounter for other preprocedural examination: Secondary | ICD-10-CM | POA: Insufficient documentation

## 2016-06-27 HISTORY — DX: Dermatitis, unspecified: L30.9

## 2016-06-27 HISTORY — DX: Anemia, unspecified: D64.9

## 2016-06-27 LAB — CBC
HEMATOCRIT: 32.5 % — AB (ref 36.0–46.0)
Hemoglobin: 10.8 g/dL — ABNORMAL LOW (ref 12.0–15.0)
MCH: 29 pg (ref 26.0–34.0)
MCHC: 33.2 g/dL (ref 30.0–36.0)
MCV: 87.1 fL (ref 78.0–100.0)
Platelets: 296 10*3/uL (ref 150–400)
RBC: 3.73 MIL/uL — ABNORMAL LOW (ref 3.87–5.11)
RDW: 13.6 % (ref 11.5–15.5)
WBC: 9.8 10*3/uL (ref 4.0–10.5)

## 2016-06-27 NOTE — Patient Instructions (Signed)
Your procedure is scheduled on:  Wednesday, July 11, 2016  Enter through the [employer] [facility] at:  6:00 AM  Pick up the phone at the desk and dial [phone].  Call this number if you have problems the morning of surgery: [phone].  Remember: Do NOT eat food or drink after:  Midnight Tuesday, July 10, 2016  Take these medicines the morning of surgery with a SIP OF WATER:  None  Do NOT wear jewelry (body piercing), metal hair clips/bobby pins, make-up, or nail polish. Do NOT wear lotions, powders, or perfumes.  You may wear deodorant. Do NOT shave for 48 hours prior to surgery. Do NOT bring valuables to the hospital. Contacts, dentures, or bridgework may not be worn into surgery.  Have a responsible adult drive you home and stay with you for 24 hours after your procedure

## 2016-07-10 NOTE — Anesthesia Preprocedure Evaluation (Addendum)
Anesthesia Evaluation  Patient identified by MRN, date of birth, ID band Patient awake    Reviewed: Allergy & Precautions, H&P , NPO status , Patient's Chart, lab work & pertinent test results  History of Anesthesia Complications Negative for: history of anesthetic complications  Airway Mallampati: II  TM Distance: >3 FB Neck ROM: full    Dental no notable dental hx. (+) Dental Advisory Given   Pulmonary neg pulmonary ROS,    Pulmonary exam normal breath sounds clear to auscultation       Cardiovascular negative cardio ROS Normal cardiovascular exam Rhythm:regular Rate:Normal     Neuro/Psych negative neurological ROS     GI/Hepatic negative GI ROS, Neg liver ROS,   Endo/Other  negative endocrine ROS  Renal/GU negative Renal ROS     Musculoskeletal   Abdominal   Peds  Hematology  (+) anemia ,   Anesthesia Other Findings   Reproductive/Obstetrics negative OB ROS                            Anesthesia Physical Anesthesia Plan  ASA: II  Anesthesia Plan: General   Post-op Pain Management:    Induction: Intravenous  Airway Management Planned: Oral ETT  Additional Equipment:   Intra-op Plan:   Post-operative Plan: Extubation in OR  Informed Consent: I have reviewed the patients History and Physical, chart, labs and discussed the procedure including the risks, benefits and alternatives for the proposed anesthesia with the patient or authorized representative who has indicated his/her understanding and acceptance.   Dental Advisory Given  Plan Discussed with: Anesthesiologist, CRNA and Surgeon  Anesthesia Plan Comments:         Anesthesia Quick Evaluation

## 2016-07-11 ENCOUNTER — Ambulatory Visit (HOSPITAL_COMMUNITY): Payer: BC Managed Care – PPO | Admitting: Anesthesiology

## 2016-07-11 ENCOUNTER — Ambulatory Visit (HOSPITAL_COMMUNITY)
Admission: RE | Admit: 2016-07-11 | Discharge: 2016-07-11 | Disposition: A | Discharge status: Home / Self Care | Payer: BC Managed Care – PPO | Source: Ambulatory Visit | Attending: Obstetrics and Gynecology | Admitting: Obstetrics and Gynecology

## 2016-07-11 ENCOUNTER — Encounter (HOSPITAL_COMMUNITY)
Admission: RE | Disposition: A | Discharge status: Home / Self Care | Payer: Self-pay | Source: Ambulatory Visit | Attending: Obstetrics and Gynecology

## 2016-07-11 ENCOUNTER — Encounter (HOSPITAL_COMMUNITY): Payer: Self-pay

## 2016-07-11 DIAGNOSIS — N8302 Follicular cyst of left ovary: Secondary | ICD-10-CM | POA: Diagnosis not present

## 2016-07-11 DIAGNOSIS — N8301 Follicular cyst of right ovary: Secondary | ICD-10-CM | POA: Insufficient documentation

## 2016-07-11 DIAGNOSIS — N80129 Deep endometriosis of ovary, unspecified ovary: Secondary | ICD-10-CM

## 2016-07-11 DIAGNOSIS — N801 Endometriosis of ovary: Secondary | ICD-10-CM | POA: Diagnosis not present

## 2016-07-11 DIAGNOSIS — N83202 Unspecified ovarian cyst, left side: Secondary | ICD-10-CM | POA: Diagnosis present

## 2016-07-11 DIAGNOSIS — D649 Anemia, unspecified: Secondary | ICD-10-CM | POA: Diagnosis not present

## 2016-07-11 DIAGNOSIS — D259 Leiomyoma of uterus, unspecified: Secondary | ICD-10-CM | POA: Diagnosis not present

## 2016-07-11 DIAGNOSIS — N809 Endometriosis, unspecified: Secondary | ICD-10-CM

## 2016-07-11 HISTORY — PX: ROBOTIC ASSISTED LAPAROSCOPIC OVARIAN CYSTECTOMY: SHX6081

## 2016-07-11 LAB — PREGNANCY, URINE: PREG TEST UR: NEGATIVE

## 2016-07-11 SURGERY — ROBOTIC ASSISTED LAPAROSCOPIC OVARIAN CYSTECTOMY
Anesthesia: General | Site: Abdomen | Laterality: Bilateral

## 2016-07-11 MED ORDER — ROCURONIUM BROMIDE 100 MG/10ML IV SOLN
INTRAVENOUS | Status: AC
  Filled 2016-07-11: qty 1

## 2016-07-11 MED ORDER — SCOPOLAMINE 1 MG/3DAYS TD PT72
MEDICATED_PATCH | TRANSDERMAL | Status: AC
  Administered 2016-07-11: 1.5 mg via TRANSDERMAL
  Filled 2016-07-11: qty 1

## 2016-07-11 MED ORDER — OXYCODONE HCL 5 MG/5ML PO SOLN: 5.0000 mg | Freq: Once | ORAL | Status: AC | PRN

## 2016-07-11 MED ORDER — LIDOCAINE HCL (CARDIAC) 20 MG/ML IV SOLN
INTRAVENOUS | Status: DC | PRN
  Administered 2016-07-11: 50 mg via INTRAVENOUS

## 2016-07-11 MED ORDER — KETOROLAC TROMETHAMINE 30 MG/ML IJ SOLN
INTRAMUSCULAR | Status: AC
  Filled 2016-07-11: qty 1

## 2016-07-11 MED ORDER — LACTATED RINGERS IV SOLN
INTRAVENOUS | Status: DC
  Administered 2016-07-11: 125 mL/h via INTRAVENOUS
  Administered 2016-07-11 (×2): via INTRAVENOUS

## 2016-07-11 MED ORDER — DEXAMETHASONE SODIUM PHOSPHATE 4 MG/ML IJ SOLN
INTRAMUSCULAR | Status: DC | PRN
  Administered 2016-07-11: 4 mg via INTRAVENOUS

## 2016-07-11 MED ORDER — FENTANYL CITRATE (PF) 100 MCG/2ML IJ SOLN
INTRAMUSCULAR | Status: DC | PRN
  Administered 2016-07-11: 50 ug via INTRAVENOUS
  Administered 2016-07-11 (×2): 100 ug via INTRAVENOUS

## 2016-07-11 MED ORDER — ONDANSETRON HCL 4 MG/2ML IJ SOLN
INTRAMUSCULAR | Status: AC
  Filled 2016-07-11: qty 2

## 2016-07-11 MED ORDER — FENTANYL CITRATE (PF) 250 MCG/5ML IJ SOLN
INTRAMUSCULAR | Status: AC
  Filled 2016-07-11: qty 5

## 2016-07-11 MED ORDER — ARTIFICIAL TEARS OP OINT
TOPICAL_OINTMENT | OPHTHALMIC | Status: AC
  Filled 2016-07-11: qty 3.5

## 2016-07-11 MED ORDER — DEXAMETHASONE SODIUM PHOSPHATE 4 MG/ML IJ SOLN
INTRAMUSCULAR | Status: AC
  Filled 2016-07-11: qty 1

## 2016-07-11 MED ORDER — SCOPOLAMINE 1 MG/3DAYS TD PT72
1.0000 | MEDICATED_PATCH | Freq: Once | TRANSDERMAL | Status: DC
  Administered 2016-07-11: 1.5 mg via TRANSDERMAL

## 2016-07-11 MED ORDER — LACTATED RINGERS IR SOLN
Status: DC | PRN
  Administered 2016-07-11 (×3): 3000 mL

## 2016-07-11 MED ORDER — HYDROCODONE-ACETAMINOPHEN 5-325 MG PO TABS: 1.0000 | ORAL_TABLET | Freq: Four times a day (QID) | ORAL | 0 refills | Status: DC | PRN

## 2016-07-11 MED ORDER — PROPOFOL 10 MG/ML IV BOLUS
INTRAVENOUS | Status: AC
  Filled 2016-07-11: qty 20

## 2016-07-11 MED ORDER — MIDAZOLAM HCL 5 MG/5ML IJ SOLN
INTRAMUSCULAR | Status: DC | PRN
  Administered 2016-07-11: 2 mg via INTRAVENOUS

## 2016-07-11 MED ORDER — PROMETHAZINE HCL 25 MG/ML IJ SOLN: 6.2500 mg | INTRAMUSCULAR | Status: DC | PRN

## 2016-07-11 MED ORDER — ROPIVACAINE HCL 5 MG/ML IJ SOLN
INTRAMUSCULAR | Status: AC
  Filled 2016-07-11: qty 30

## 2016-07-11 MED ORDER — ROCURONIUM BROMIDE 100 MG/10ML IV SOLN
INTRAVENOUS | Status: DC | PRN
  Administered 2016-07-11: 10 mg via INTRAVENOUS
  Administered 2016-07-11: 40 mg via INTRAVENOUS
  Administered 2016-07-11: 10 mg via INTRAVENOUS

## 2016-07-11 MED ORDER — OXYCODONE HCL 5 MG PO TABS
ORAL_TABLET | ORAL | Status: AC
  Filled 2016-07-11: qty 1

## 2016-07-11 MED ORDER — MIDAZOLAM HCL 2 MG/2ML IJ SOLN
INTRAMUSCULAR | Status: AC
  Filled 2016-07-11: qty 2

## 2016-07-11 MED ORDER — HYDROMORPHONE HCL 1 MG/ML IJ SOLN
INTRAMUSCULAR | Status: DC | PRN
  Administered 2016-07-11: 0.5 mg via INTRAVENOUS

## 2016-07-11 MED ORDER — FENTANYL CITRATE (PF) 100 MCG/2ML IJ SOLN
25.0000 ug | INTRAMUSCULAR | Status: DC | PRN
  Administered 2016-07-11 (×2): 50 ug via INTRAVENOUS

## 2016-07-11 MED ORDER — ARTIFICIAL TEARS OP OINT
TOPICAL_OINTMENT | OPHTHALMIC | Status: DC | PRN
  Administered 2016-07-11: 1 via OPHTHALMIC

## 2016-07-11 MED ORDER — OXYCODONE HCL 5 MG PO TABS
5.0000 mg | ORAL_TABLET | Freq: Once | ORAL | Status: AC | PRN
  Administered 2016-07-11: 5 mg via ORAL

## 2016-07-11 MED ORDER — SODIUM CHLORIDE 0.9 % IJ SOLN
INTRAMUSCULAR | Status: AC
  Filled 2016-07-11: qty 50

## 2016-07-11 MED ORDER — SUGAMMADEX SODIUM 200 MG/2ML IV SOLN
INTRAVENOUS | Status: DC | PRN
  Administered 2016-07-11: 142.8 mg via INTRAVENOUS

## 2016-07-11 MED ORDER — KETOROLAC TROMETHAMINE 30 MG/ML IJ SOLN
INTRAMUSCULAR | Status: DC | PRN
  Administered 2016-07-11: 30 mg via INTRAMUSCULAR
  Administered 2016-07-11: 30 mg via INTRAVENOUS

## 2016-07-11 MED ORDER — HYDROMORPHONE HCL 1 MG/ML IJ SOLN: 0.2500 mg | INTRAMUSCULAR | Status: DC | PRN

## 2016-07-11 MED ORDER — ONDANSETRON HCL 4 MG/2ML IJ SOLN
INTRAMUSCULAR | Status: DC | PRN
  Administered 2016-07-11: 4 mg via INTRAVENOUS

## 2016-07-11 MED ORDER — HYDROMORPHONE HCL 1 MG/ML IJ SOLN
INTRAMUSCULAR | Status: AC
  Filled 2016-07-11: qty 1

## 2016-07-11 MED ORDER — LIDOCAINE HCL (PF) 1 % IJ SOLN
INTRAMUSCULAR | Status: AC
  Filled 2016-07-11: qty 5

## 2016-07-11 MED ORDER — ROPIVACAINE HCL 5 MG/ML IJ SOLN
INTRAMUSCULAR | Status: DC | PRN
  Administered 2016-07-11: 11:00:00

## 2016-07-11 MED ORDER — ONDANSETRON HCL 4 MG/2ML IJ SOLN: 4.0000 mg | Freq: Once | INTRAMUSCULAR | Status: DC | PRN

## 2016-07-11 MED ORDER — PROPOFOL 10 MG/ML IV BOLUS
INTRAVENOUS | Status: DC | PRN
  Administered 2016-07-11: 200 mg via INTRAVENOUS

## 2016-07-11 MED ORDER — FENTANYL CITRATE (PF) 100 MCG/2ML IJ SOLN
INTRAMUSCULAR | Status: AC
  Administered 2016-07-11: 50 ug via INTRAVENOUS
  Filled 2016-07-11: qty 2

## 2016-07-11 MED ORDER — GLYCOPYRROLATE 0.2 MG/ML IJ SOLN
INTRAMUSCULAR | Status: DC | PRN
  Administered 2016-07-11: 0.2 mg via INTRAVENOUS

## 2016-07-11 MED ORDER — NAPROXEN SODIUM ER 500 MG PO TB24: ORAL_TABLET | ORAL | 1 refills | Status: DC

## 2016-07-11 SURGICAL SUPPLY — 47 items
BARRIER ADHS 3X4 INTERCEED (GAUZE/BANDAGES/DRESSINGS) IMPLANT
CATH FOLEY 3WAY  5CC 16FR (CATHETERS) ×2
CATH FOLEY 3WAY 5CC 16FR (CATHETERS) ×1 IMPLANT
CLOTH BEACON ORANGE TIMEOUT ST (SAFETY) ×3 IMPLANT
CONT PATH 16OZ SNAP LID 3702 (MISCELLANEOUS) ×3 IMPLANT
COVER BACK TABLE 60X90IN (DRAPES) ×6 IMPLANT
COVER TIP SHEARS 8 DVNC (MISCELLANEOUS) ×1 IMPLANT
COVER TIP SHEARS 8MM DA VINCI (MISCELLANEOUS) ×2
DECANTER SPIKE VIAL GLASS SM (MISCELLANEOUS) ×6 IMPLANT
DEFOGGER SCOPE WARMER CLEARIFY (MISCELLANEOUS) ×3 IMPLANT
DRSG OPSITE POSTOP 3X4 (GAUZE/BANDAGES/DRESSINGS) ×3 IMPLANT
DURAPREP 26ML APPLICATOR (WOUND CARE) ×3 IMPLANT
ELECT REM PT RETURN 9FT ADLT (ELECTROSURGICAL) ×3
ELECTRODE REM PT RTRN 9FT ADLT (ELECTROSURGICAL) ×1 IMPLANT
GAUZE VASELINE 3X9 (GAUZE/BANDAGES/DRESSINGS) IMPLANT
GLOVE BIOGEL PI IND STRL 7.0 (GLOVE) ×3 IMPLANT
GLOVE BIOGEL PI INDICATOR 7.0 (GLOVE) ×6
GLOVE ECLIPSE 6.5 STRL STRAW (GLOVE) ×9 IMPLANT
KIT ACCESSORY DA VINCI DISP (KITS) ×2
KIT ACCESSORY DVNC DISP (KITS) ×1 IMPLANT
LIQUID BAND (GAUZE/BANDAGES/DRESSINGS) ×3 IMPLANT
PACK ROBOT WH (CUSTOM PROCEDURE TRAY) ×3 IMPLANT
PACK ROBOTIC GOWN (GOWN DISPOSABLE) ×3 IMPLANT
PAD PREP 24X48 CUFFED NSTRL (MISCELLANEOUS) ×6 IMPLANT
PAD TRENDELENBURG POSITION (MISCELLANEOUS) ×3 IMPLANT
POUCH LAPAROSCOPIC INSTRUMENT (MISCELLANEOUS) ×3 IMPLANT
POUCH SPECIMEN RETRIEVAL 10MM (ENDOMECHANICALS) ×3 IMPLANT
SET CYSTO W/LG BORE CLAMP LF (SET/KITS/TRAYS/PACK) IMPLANT
SET IRRIG TUBING LAPAROSCOPIC (IRRIGATION / IRRIGATOR) ×3 IMPLANT
SET TRI-LUMEN FLTR TB AIRSEAL (TUBING) IMPLANT
SUT MNCRL AB 3-0 PS2 27 (SUTURE) IMPLANT
SUT VIC AB 0 CT2 27 (SUTURE) IMPLANT
SUT VIC AB 2-0 CT2 27 (SUTURE) ×3 IMPLANT
SUT VICRYL 0 UR6 27IN ABS (SUTURE) ×6 IMPLANT
SUT VLOC 180 0 9IN  GS21 (SUTURE)
SUT VLOC 180 0 9IN GS21 (SUTURE) IMPLANT
SYR 50ML LL SCALE MARK (SYRINGE) ×3 IMPLANT
SYSTEM CONVERTIBLE TROCAR (TROCAR) ×3 IMPLANT
TIP UTERINE 5.1X6CM LAV DISP (MISCELLANEOUS) ×3 IMPLANT
TIP UTERINE 6.7X10CM GRN DISP (MISCELLANEOUS) IMPLANT
TIP UTERINE 6.7X6CM WHT DISP (MISCELLANEOUS) IMPLANT
TIP UTERINE 6.7X8CM BLUE DISP (MISCELLANEOUS) IMPLANT
TOWEL OR 17X24 6PK STRL BLUE (TOWEL DISPOSABLE) ×12 IMPLANT
TROCAR 12M 150ML BLUNT (TROCAR) ×3 IMPLANT
TROCAR DISP BLADELESS 8 DVNC (TROCAR) ×1 IMPLANT
TROCAR DISP BLADELESS 8MM (TROCAR) ×2
TROCAR PORT AIRSEAL 8X120 (TROCAR) ×3 IMPLANT

## 2016-07-11 NOTE — Discharge Instructions (Signed)
DISCHARGE INSTRUCTIONS: Laparoscopy  The following instructions have been prepared to help you care for yourself upon your return home today.  Wound care:  Do not get the incision wet for the first 24 hours. The incision should be kept clean and dry.  The Band-Aids or dressings may be removed the day after surgery.  Should the incision become sore, red, and swollen after the first week, check with your doctor.  Personal hygiene:  Shower the day after your procedure.  Activity and limitations:  Do NOT drive or operate any equipment today.  Do NOT lift anything more than 15 pounds for 2-3 weeks after surgery.  Do NOT rest in bed all day.  Walking is encouraged. Walk each day, starting slowly with 5-minute walks 3 or 4 times a day. Slowly increase the length of your walks.  Walk up and down stairs slowly.  Do NOT do strenuous activities, such as golfing, playing tennis, bowling, running, biking, weight lifting, gardening, mowing, or vacuuming for 2-4 weeks. Ask your doctor when it is okay to start.  Diet: Eat a light meal as desired this evening. You may resume your usual diet tomorrow.  Return to work: This is dependent on the type of work you do. For the most part you can return to a desk job within a week of surgery. If you are more active at work, please discuss this with your doctor.  What to expect after your surgery: You may have a slight burning sensation when you urinate on the first day. You may have a very small amount of blood in the urine. Expect to have a small amount of vaginal discharge/light bleeding for 1-2 weeks. It is not unusual to have abdominal soreness and bruising for up to 2 weeks. You may be tired and need more rest for about 1 week. You may experience shoulder pain for 24-72 hours. Lying flat in bed may relieve it.  Call your doctor for any of the following:  Develop a fever of 100.4 or greater  Inability to urinate 6 hours after discharge from  hospital  Severe pain not relieved by pain medications  Persistent of heavy bleeding at incision site  Redness or swelling around incision site after a week  Increasing nausea or vomiting  Patient Signature________________________________________ Nurse Signature_________________________________________Call [facility] @ [phone] if:  You have a temperature greater than or equal to 100.4 degrees Farenheit orally You have pain that is not made better by the pain medication given and taken as directed You have excessive bleeding or problems urinating  Take Colace (Docusate Sodium/Stool Softener) 100 mg 2-3 times daily while taking narcotic pain medicine to avoid constipation or until bowel movements are regular. Take Naproxen 500 mg  with food, twice a day  for 5 days then as needed for pain;  Take first dose 07/01/16 at 5 pm  You may drive after 24 hours You may walk up steps  You may shower tomorrow You may resume a regular diet  Keep incisions clean and dry Do not lift over 15 pounds for 6 weeks Avoid anything in vagina for 6 weeks (or until after your post-operative visit)

## 2016-07-11 NOTE — OR Nursing (Signed)
Family updated with surgery progress and patient condition

## 2016-07-11 NOTE — Anesthesia Procedure Notes (Signed)
Procedure Name: Intubation Date/Time: 07/11/2016 7:31 AM Performed by: Bufford Spikes Pre-anesthesia Checklist: Patient identified, Timeout performed, Emergency Drugs available, Suction available and Patient being monitored Patient Re-evaluated:Patient Re-evaluated prior to inductionOxygen Delivery Method: Circle system utilized Preoxygenation: Pre-oxygenation with 100% oxygen Intubation Type: IV induction Ventilation: Mask ventilation without difficulty Laryngoscope Size: Miller and 2 Grade View: Grade I Tube type: Oral Tube size: 7.0 mm Number of attempts: 1 Airway Equipment and Method: Stylet Placement Confirmation: ETT inserted through vocal cords under direct vision,  positive ETCO2 and breath sounds checked- equal and bilateral Secured at: 20 cm Tube secured with: Tape Dental Injury: Teeth and Oropharynx as per pre-operative assessment

## 2016-07-11 NOTE — Anesthesia Postprocedure Evaluation (Signed)
Anesthesia Post Note  Patient: Ebony Valentine  Procedure(s) Performed: Procedure(s) (LRB): ROBOTIC ASSISTED LAPAROSCOPIC OVARIAN CYSTECTOMY (Bilateral)  Patient location during evaluation: PACU Anesthesia Type: General Level of consciousness: awake and alert Pain management: pain level controlled Vital Signs Assessment: post-procedure vital signs reviewed and stable Respiratory status: spontaneous breathing, nonlabored ventilation, respiratory function stable and patient connected to nasal cannula oxygen Cardiovascular status: blood pressure returned to baseline and stable Postop Assessment: no signs of nausea or vomiting Anesthetic complications: no     Last Vitals:  Vitals:   07/11/16 1103 07/11/16 1115  BP: 110/67 113/64  Pulse: 75 78  Resp: 14 14  Temp: 36.4 C     Last Pain:  Vitals:   07/11/16 0612  TempSrc: Oral   Pain Goal: Patients Stated Pain Goal: 3 (07/11/16 0612)               Reginal Lutes

## 2016-07-11 NOTE — Transfer of Care (Signed)
Immediate Anesthesia Transfer of Care Note  Patient: Ebony Valentine  Procedure(s) Performed: Procedure(s): ROBOTIC ASSISTED LAPAROSCOPIC OVARIAN CYSTECTOMY (Bilateral)  Patient Location: PACU  Anesthesia Type:General  Level of Consciousness: awake, alert  and sedated  Airway & Oxygen Therapy: Patient Spontanous Breathing and Patient connected to nasal cannula oxygen  Post-op Assessment: Report given to RN and Post -op Vital signs reviewed and stable  Post vital signs: Reviewed and stable  Last Vitals:  Vitals:   07/11/16 0612  BP: 111/84  Pulse: 80  Resp: 16  Temp: 36.9 C    Last Pain:  Vitals:   07/11/16 0612  TempSrc: Oral      Patients Stated Pain Goal: 3 ([id number])  Complications: No apparent anesthesia complications

## 2016-07-11 NOTE — Op Note (Signed)
Preoperative diagnosis: Bilateral ovarian cysts  Postoperative diagnosis: Bilateral endometrioma with stage 4 endometriosis, uterine fibroids  Anesthesia: General   Anesthesiologist: Dr. Lauretta Grill  Procedure: Robotically assisted bilateral ovarian cystectomy with pelvic washings  Surgeon: Dr. Katharine Look Demia Viera   Assistant: Earnstine Regal P.A.-C .  Estimated blood loss: 100 cc   Procedure:   After being informed of the planned procedure with possible complications including but not limited to bleeding, infection, injury to other organs, need for laparotomy, possible need for morcellation with risks and benefits reviewed, expected hospital stay and recovery, informed consent is obtained and patient is taken to or #7. She is placed in lithotomy position on a Pink pad with both arms padded and tucked on each side and bilateral knee-high sequential compressive devices. She is given general anesthesia with endotracheal intubation without any complication. She is prepped and draped in a sterile fashion. A three-way Foley catheter is inserted in her bladder.   Pelvic exam reveals: Uterus is enlarged with a fundal fibroid measuring approximately 4-5 cm and barely mobile restricted by a large anterior cul-de-sac mass that is approximately 7-8 cm. The pelvic exam is greatly limited by the frozen pelvis.  A weighted speculum is inserted in the vagina and the anterior lip of the cervix is grasped with a tenaculum forcep. We proceed with a paracervical block and vaginal infiltration using ropivacaine 0.5% diluted 1 in 1 with saline. The uterus was then sounded at 6 cm. We easily dilate the cervix using Hegar dilator to #27 which allows for easy placement of the intrauterine RUMI manipulator.  Trocar placement is decided. We infiltrate  the umbilicus with 10 cc of ropivacaine per protocol and perform a 10 mm semi-elliptical incision which is brought down bluntly to the fascia. The fascia is identified and  grasped with Coker forceps. The fascia is incised with Mayo scissors. Peritoneum is entered bluntly. A pursestring suture of 0 Vicryl is placed on the fascia and a 10 mm Hassan trocar is easily inserted in the abdominal cavity held in placed with a Purstring suture. This allows for easy insufflation of a pneumoperitoneum using warmed CO2 at a maximum pressure of 15 mm of mercury. 60 cc of Ropivacaine 0.5 % diluted 1 in 1 is sent in the pelvis and the patient is positioned in reverse Trendelenburg. We then placed two 33mm robotic trocar on the left, one 26mm robotic trocar on the right and one 8 mm patient's side assistant trocar on the right after infiltrating every site with ropivacaine per protocol. The robot is docked on the left of the patient after positioning her in Trendelenburg. A monopolar scissor is inserted in arm #1, a PK gyrus forcep is inserted in arm #2 and a Prograsp is inserted in arm #3.  Preparation and docking is completed in 65  minutes.   Observation: A large left healthy mass is noted with a thick capsule and limited mobility due to adhesions with the left pelvic wall and the bowel. We can see part of the uterus on the right. We are unable to see the right ovary or the right tube. The left tube is also not accessible at this time. Appendix is not seen.  We start by proceeding with lysis of adhesions to try to free the left ovary from the pelvic wall and we are then able to mobilize 90% of the ovary. The capsule of the ovary is open with monopolar scissors and we start dissecting the ovarian cysts from the ovarian tissue bluntly  and sharply. During the process a large quantity of chocolate like fluid is evacuated. We proceed systematically until the cyst is completely removed. Its size was approximately 10-1/2-11 cm. We then irrigate profusely and proceed with cauterization of bleeding sites on the ovarian capsule. Hemostasis is adequate. We can now completely free it from the pelvic wall  with blunt dissection.  This gives [place] our first view of the uterus with a completely obliterated posterior cul-de-sac, a 5 cm subserosal fundal fibroid. We also see the left tube which appears normal and wraps around the ovarian capsule. Moving the uterus and the round ligament on the right we can see part of the right ovary that is completely M padded in the posterior cul-de-sac. We proceed systematically with blunt and sharp dissection away from the right pelvic wall and we are finally able to lift it out of the cul-de-sac. It measures approximately 9 cm and in the process ruptures and chocolate like fluid is evacuated. At the site of rupture we are able to identify the ovarian cyst wall which is then bluntly and sharply dissected away from the ovary. In the dissection process we note 3 other cysts that ruptures and are also removed. All of the cyst walls were deposited in the anterior cul-de-sac and we address cauterization of bleeding sites on the ovarian capsule. We then irrigate profusely and confirm satisfactory hemostasis. The cul-de-sac is not completely freed there are still adhesions with the sigmoid colon which are grade 4 and left intact. We then wrap each adnexa and a full sheet of Interceed and undocked the robot.   Console time is completed in 2 hours. We modified the camera for a 5 mm camera. This allows entry in the abdominal cavity with a large Endobag where we deposited all of the cyst walls and move them out through the umbilical incision. We again confirm satisfactory hemostasis.   All trochars are removed under direct visualization after evacuating the pneumoperitoneum.   The fascia of the supraumbilical incision is closed with the previously placed pursestring suture of 0 Vicryl.All incisions are then closed with subcuticular suture of 3-0 Monocryl and Dermabond.   A speculum is inserted in the vagina to confirm adequate hemostasis on the cervix.   Instrument and sponge count is  complete x2. Estimated blood loss is 100 cc.   The procedure is well tolerated by the patient who  is taken to recovery room in a well and stable condition.   Specimen: Bilateral ovarian cyst walls sent to pathology.

## 2016-07-11 NOTE — Anesthesia Procedure Notes (Signed)
Performed by: Bufford Spikes

## 2016-07-11 NOTE — Interval H&P Note (Signed)
History and Physical Interval Note:  07/11/2016 7:08 AM  Ebony Valentine  has presented today for surgery, with the diagnosis of Bilateral Ovarian Cysts, Possible Endometriomas  The various methods of treatment have been discussed with the patient and family. After consideration of risks, benefits and other options for treatment, the patient has consented to  Procedure(s): ROBOTIC ASSISTED LAPAROSCOPIC OVARIAN CYSTECTOMY (Bilateral) as a surgical intervention .  The patient's history has been reviewed, patient examined, no change in status, stable for surgery.  I have reviewed the patient's chart and labs.  Questions were answered to the patient's satisfaction.     [name]

## 2016-07-12 ENCOUNTER — Encounter (HOSPITAL_COMMUNITY): Payer: Self-pay | Admitting: Obstetrics and Gynecology

## 2017-02-15 IMAGING — CT CT ABD-PELV W/ CM
2 of 3 series · 15 of 46 positions shown, 17 images · IV contrast (OMNIPAQUE 300)
Comparison: None.

CLINICAL DATA: 30-year-old female with left lower quadrant
abdominal pain

EXAM:
CT ABDOMEN AND PELVIS WITH CONTRAST
TECHNIQUE: Multidetector CT imaging of the abdomen and pelvis was performed
using the standard protocol following bolus administration of
intravenous contrast.
CONTRAST:  100mL OMNIPAQUE IOHEXOL 300 MG/ML  SOLN

[Series 2: abd/pel with · axial · 0.72mm/px · z∈[+1110,+1510]mm · 12 of 92 slices shown, 14 images]
[im 6/92  soft-tissue]
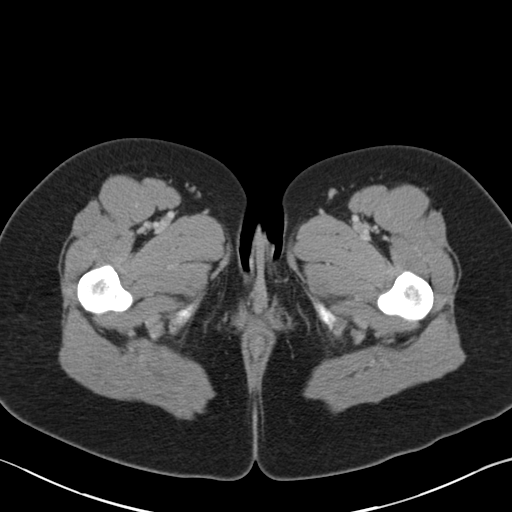
[im 6/92  bone]
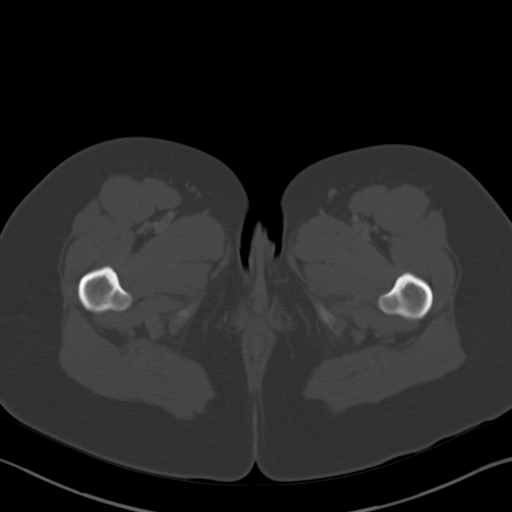
[im 12/92  soft-tissue]
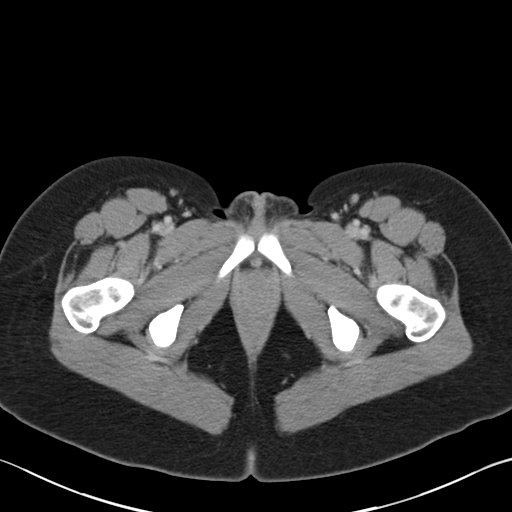
[im 21/92  soft-tissue]
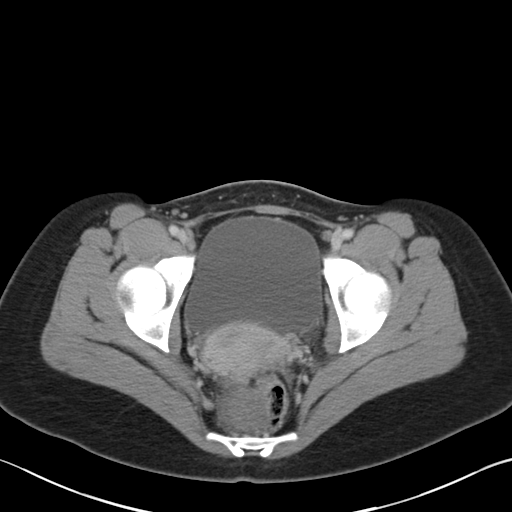
[im 27/92  soft-tissue]
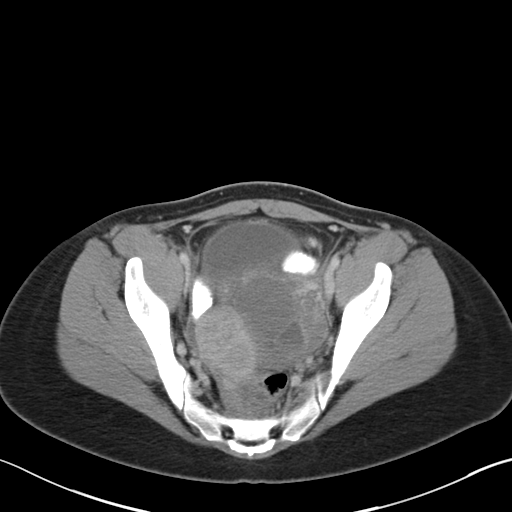
[im 36/92  soft-tissue]
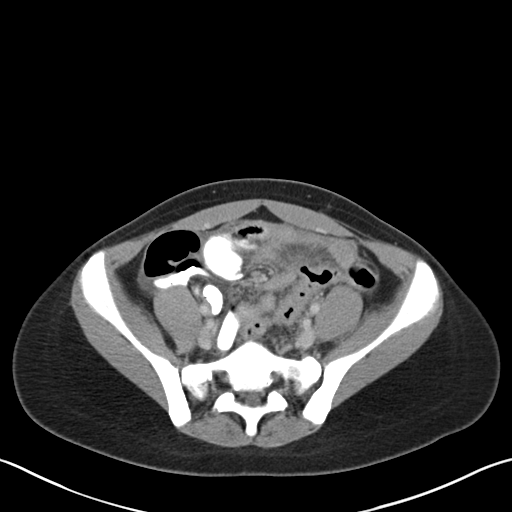
[im 42/92  soft-tissue]
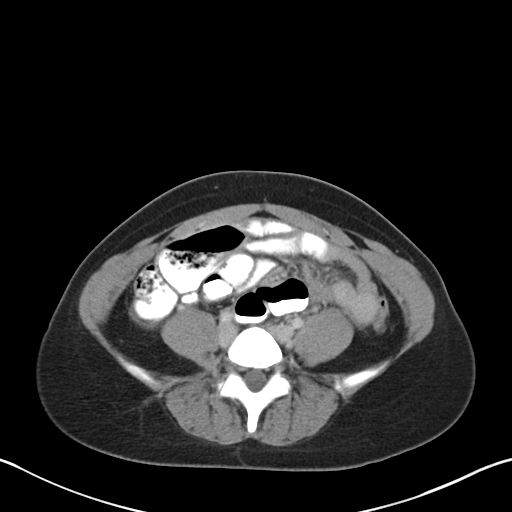
[im 50/92  soft-tissue]
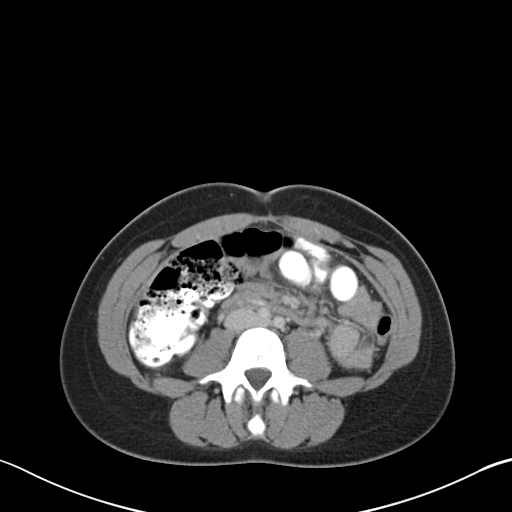
[im 56/92  soft-tissue]
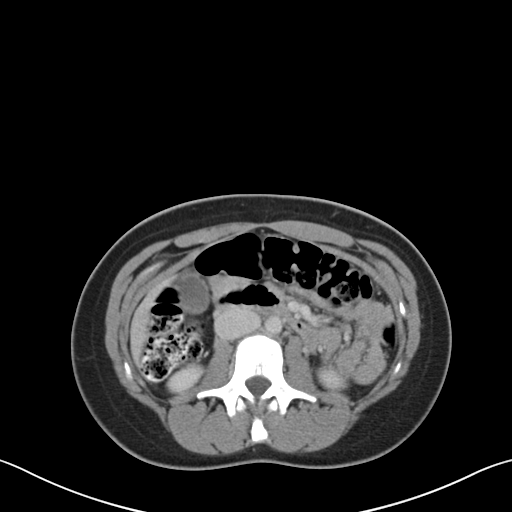
[im 65/92  soft-tissue]
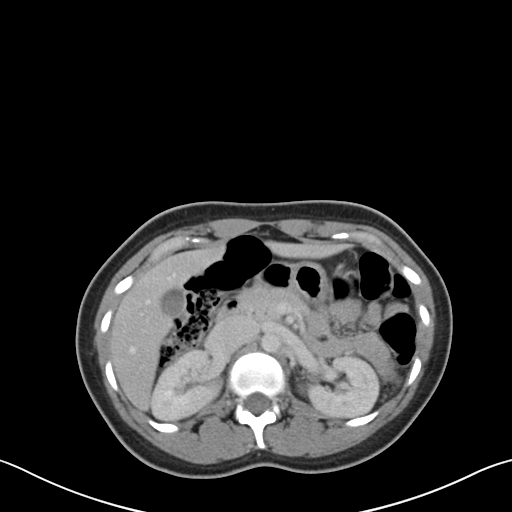
[im 65/92  bone]
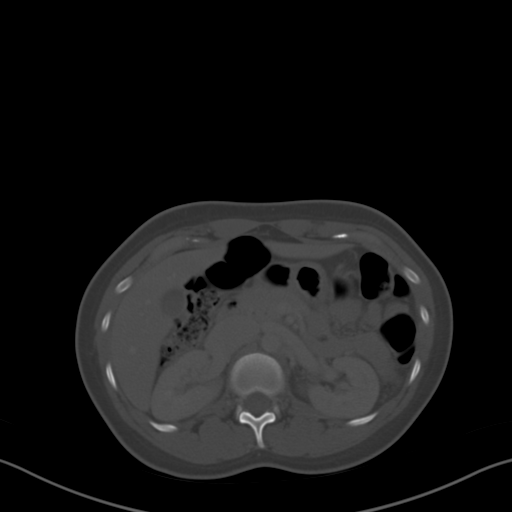
[im 71/92  soft-tissue]
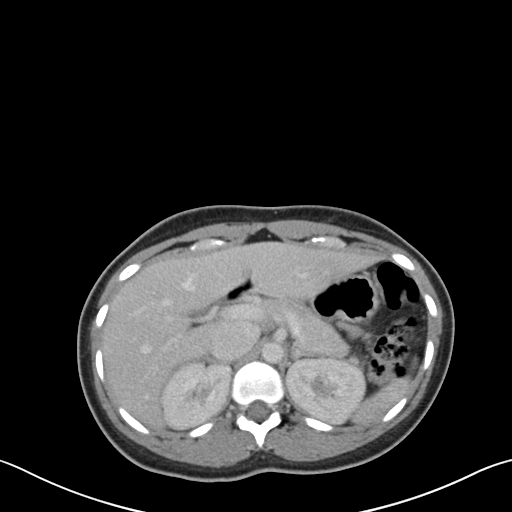
[im 80/92  soft-tissue]
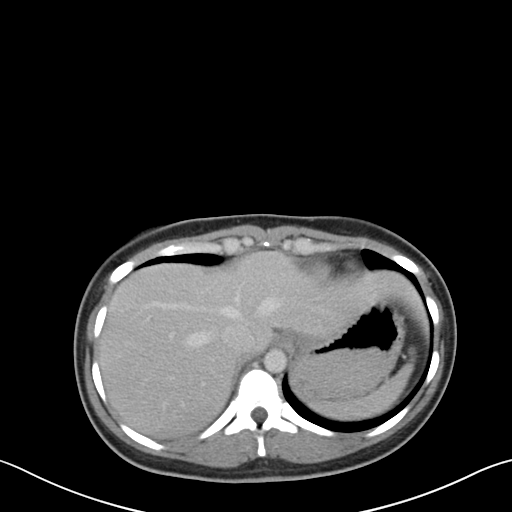
[im 86/92  soft-tissue]
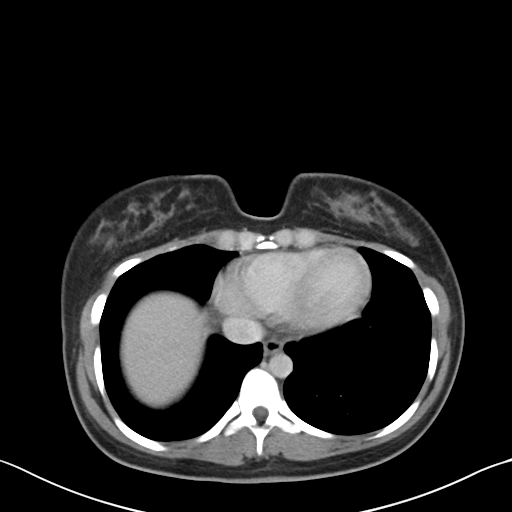

[Series 3: coronal a/|p · coronal · 0.63mm/px · 3 of 89 slices shown]
[im 30/89  soft-tissue]
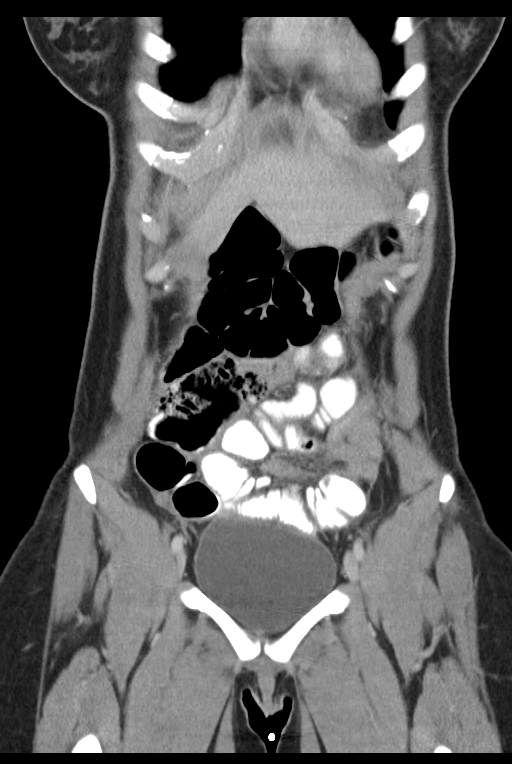
[im 40/89  soft-tissue]
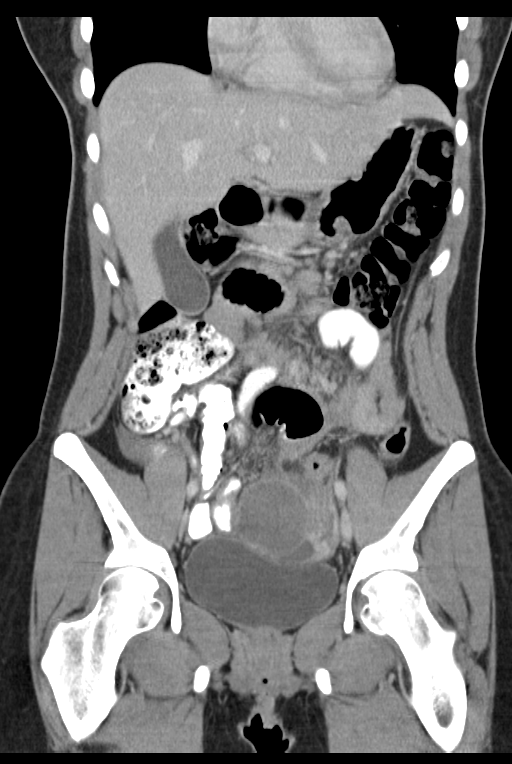
[im 49/89  soft-tissue]
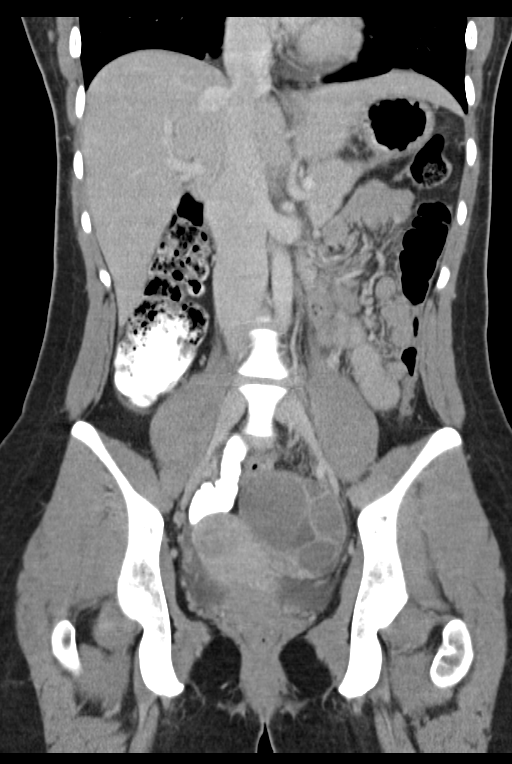

[15 of 46 positions shown; findings below may reference images not displayed]

FINDINGS: The visualized lung bases are clear. No intra-abdominal free air.
Small free fluid within the pelvis.

The liver, gallbladder, pancreas, spleen, adrenal glands kidneys,
visualized ureters, and urinary bladder appear unremarkable. There
is a 3.1 x 3.1 cm uterine fundal fibroid. There are dilated tubular
appearing structures in the region of the left adnexa as well as in
the posterior pelvis/ cul-de-sac compatible with bilateral
hydrosalpinx, and left greater right. The left adnexal tube measure
up to 4.7 cm in diameter. Clinical correlation is recommended to
evaluate for pelvic inflammatory disease. Further evaluation with
pelvic ultrasound is recommended.

There is mild apparent thickening and inflammatory changes of the
distal small bowel loop, likely secondary to inflammatory changes of
the pelvis. Oral contrast traverses into the colon without evidence
of bowel obstruction. Normal appendix.

The abdominal aorta and IVC appear unremarkable. No portal venous
gas identified. There is no adenopathy. Small fat containing
umbilical hernia. The osseous structures appear unremarkable.
IMPRESSION: Bilateral hydrosalpinx, left greater than right. Correlation with
clinical exam is recommended to evaluate for pelvic inflammatory
disease. Ultrasound recommended for further evaluation.

Thickened appearing distal small bowel, likely reactive to
inflammatory changes of the pelvis. Enteritis is less likely. No
bowel obstruction.

## 2017-05-10 IMAGING — US US TRANSVAGINAL NON-OB
1 series · 13 of 25 positions shown · non-contrast
Comparison: CT the abdomen and pelvis performed earlier today at
[DATE] p.m.

CLINICAL DATA: Acute onset of left lower quadrant abdominal pain
and leukocytosis. Initial encounter.

EXAM:
TRANSABDOMINAL AND TRANSVAGINAL ULTRASOUND OF PELVIS
TECHNIQUE: Both transabdominal and transvaginal ultrasound examinations of the
pelvis were performed. Transabdominal technique was performed for
global imaging of the pelvis including uterus, ovaries, adnexal
regions, and pelvic cul-de-sac. It was necessary to proceed with
endovaginal exam following the transabdominal exam to visualize the
endometrium and ovaries in greater detail.

[Series 1: us transvaginal non-ob · 0.24mm/px · 13 of 83 slices shown]
[im 1/83]
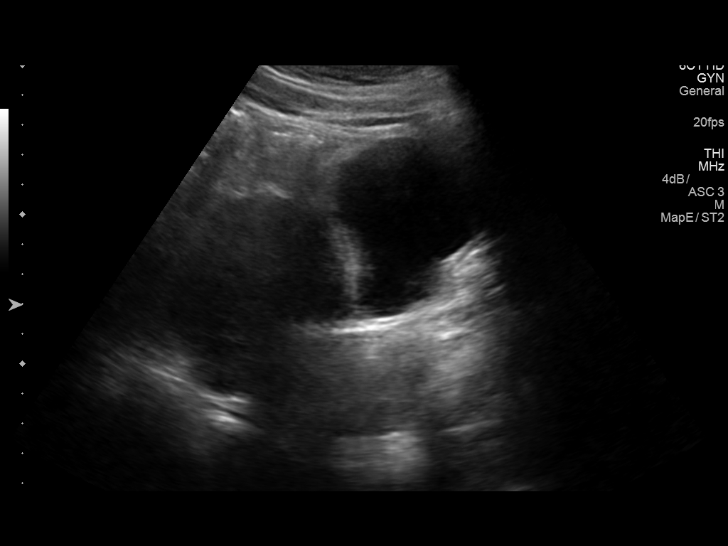
[im 7/83]
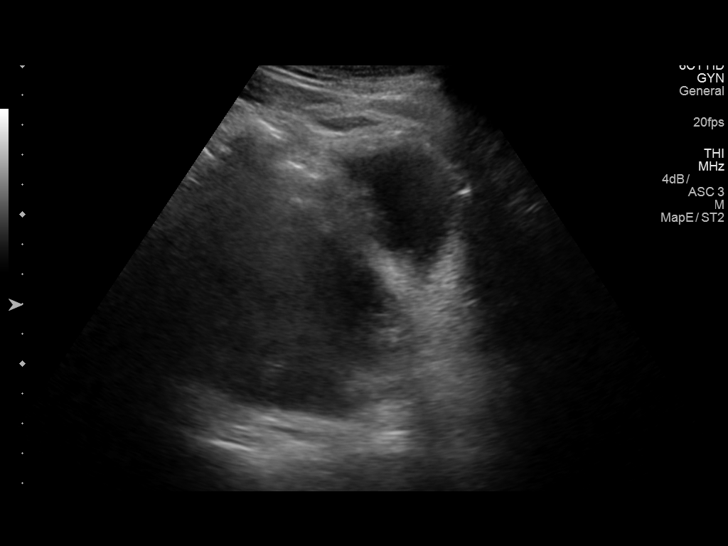
[im 14/83]
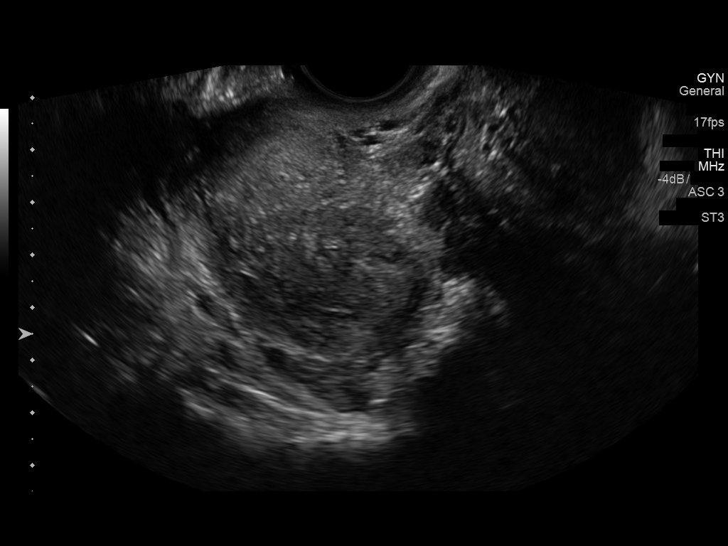
[im 21/83]
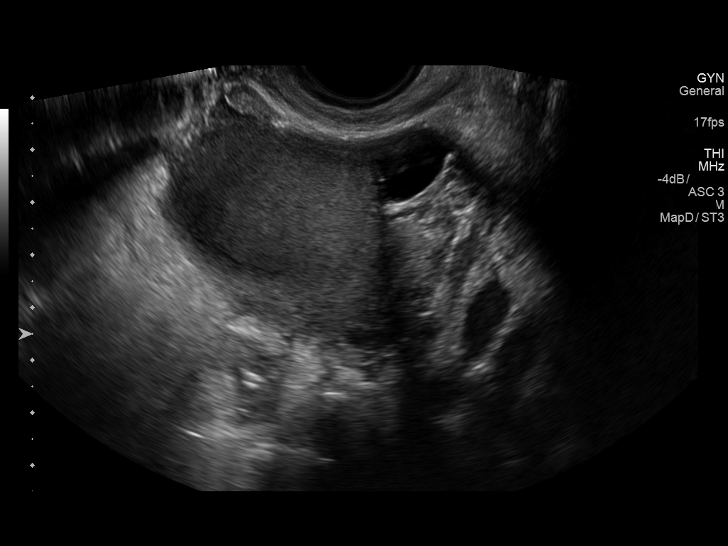
[im 28/83]
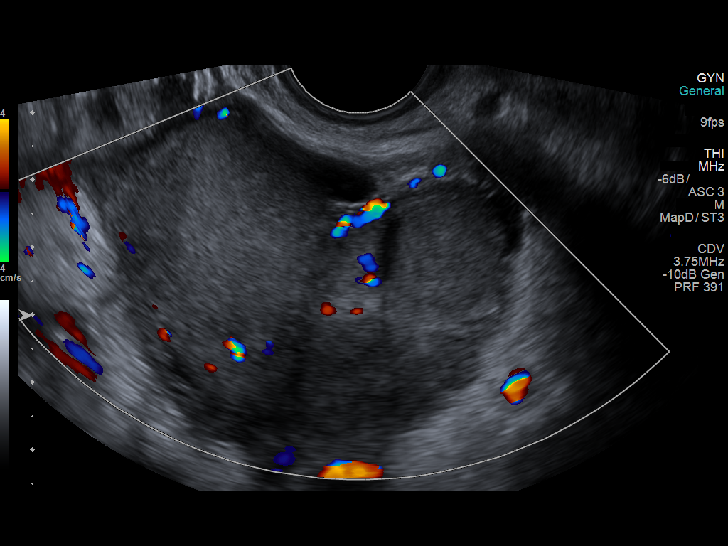
[im 35/83]
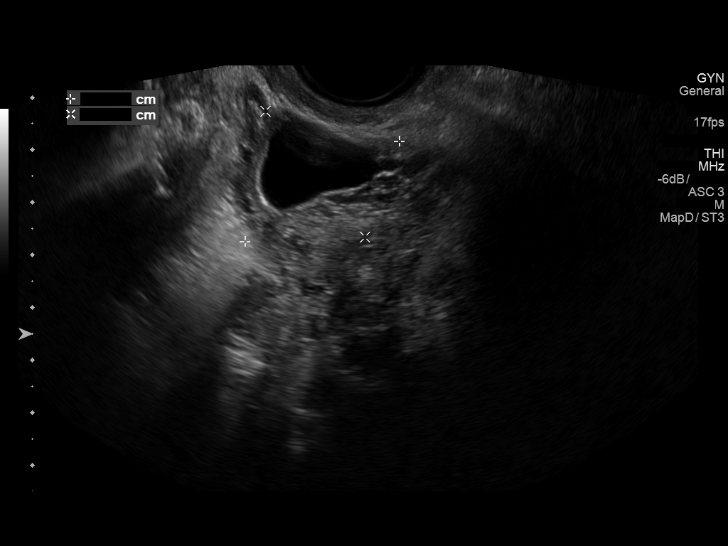
[im 42/83]
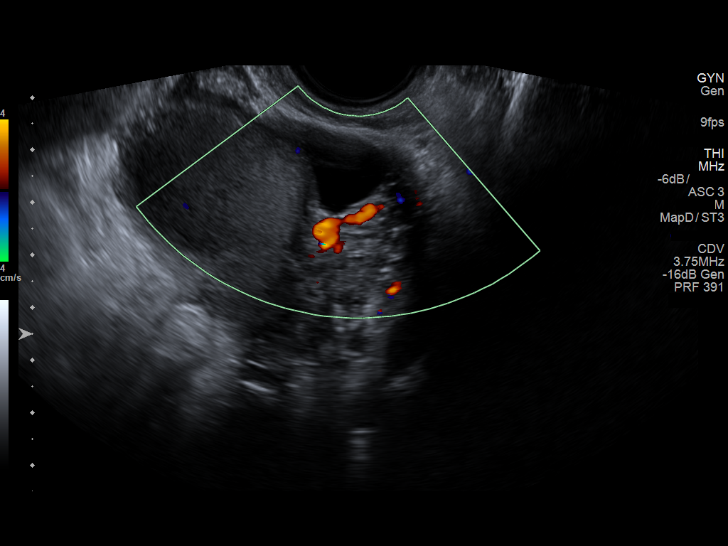
[im 48/83]
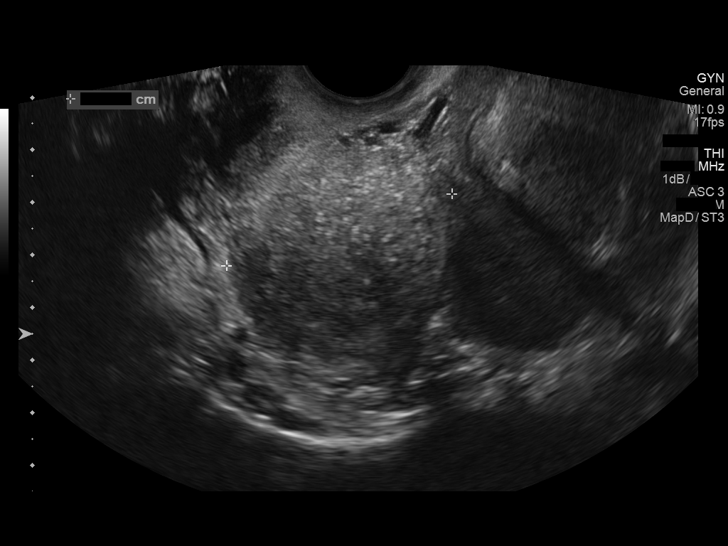
[im 55/83]
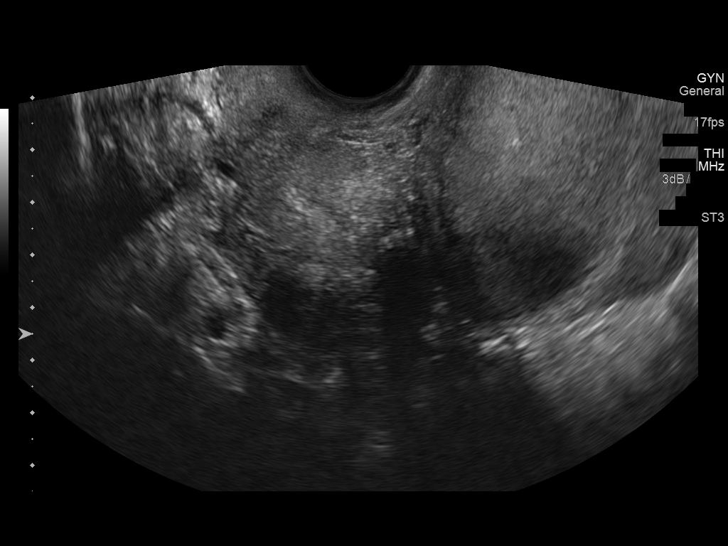
[im 62/83]
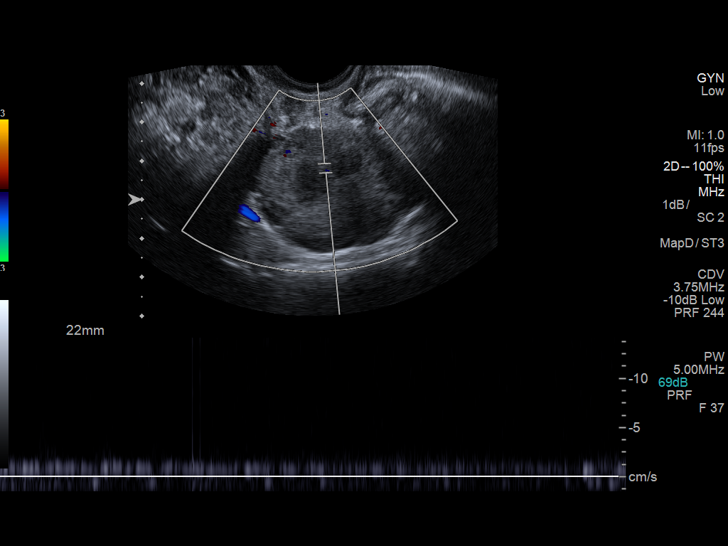
[im 69/83]
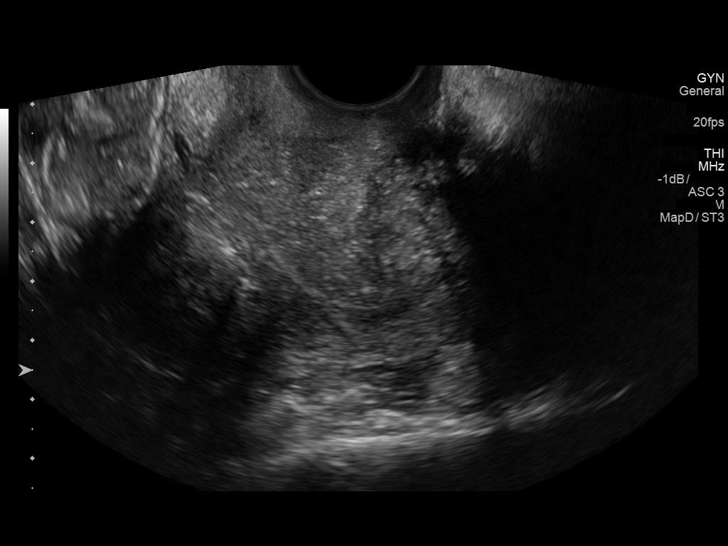
[im 76/83]
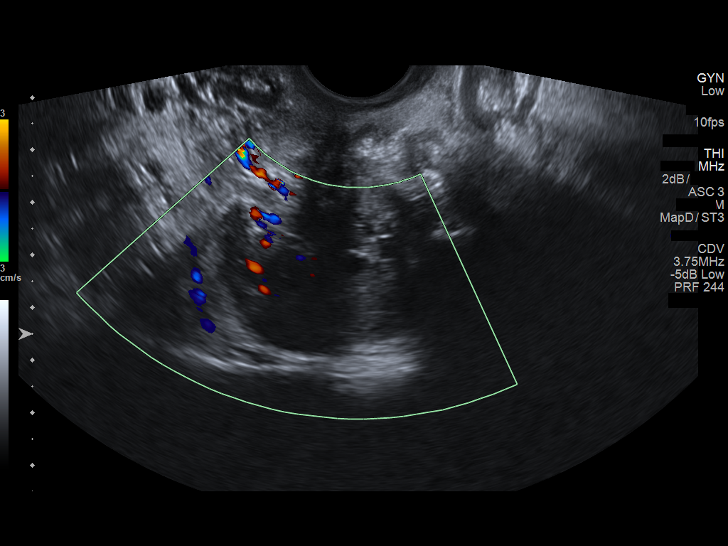
[im 83/83]
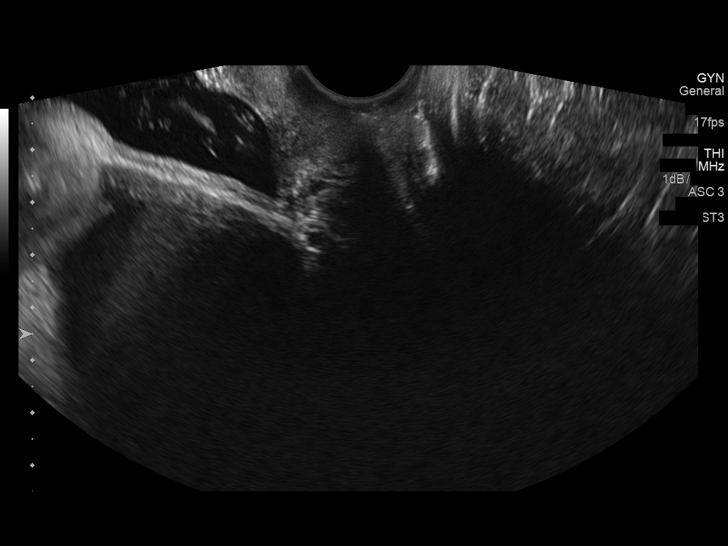

[13 of 25 positions shown; findings below may reference images not displayed]

FINDINGS: Uterus

Measurements: 4.9 x 3.8 x 4.4 cm. There appears to be a subserosal
3.1 cm fibroid.

Endometrium

Thickness: 0.3 cm.  No focal abnormality visualized.

Right ovary

Measurements: 4.5 x 3.4 x 3.3 cm. A nonspecific mildly complex
cystic focus is noted within the right ovary, measuring 1.9 cm. This
may remain within normal limits.

Left ovary

Measurements: 3.5 x 3.1 x 3.0 cm. There is a large complex loculated
left adnexal fluid containing structure, measuring 8.1 x 7.6 x
cm, with displacement of the uterus and right ovary. Surrounding
mildly increased blood flow is noted. This is suspicious for
pyosalpinx, though tubo-ovarian abscess cannot be entirely excluded.

Other findings

A small amount of free fluid is seen within the pelvic cul-de-sac.
IMPRESSION: 1. Large complex loculated left adnexal fluid containing structure,
measuring 8.1 x 7.6 x 4.9 cm, with displacement of the uterus and
right ovary. This corresponds to the patient's left-sided symptoms
and is suspicious for left-sided pyosalpinx, though a tubo-ovarian
abscess cannot be entirely excluded. Surrounding hyperemia noted.
2. Nonspecific mildly complex 1.9 cm cystic focus within the right
ovary may remain within normal limits.
3. Subserosal 3.1 cm fibroid. Uterus otherwise grossly unremarkable.
4. No evidence for ovarian torsion. Visualized left ovarian tissue
is unremarkable.
These results were called by telephone at the time of interpretation
on 12/23/2015 at [DATE] to RUDI JUMPER PA, who verbally acknowledged
these results.

## 2017-07-12 ENCOUNTER — Other Ambulatory Visit (HOSPITAL_COMMUNITY): Payer: Self-pay | Admitting: Obstetrics and Gynecology

## 2017-07-12 DIAGNOSIS — N979 Female infertility, unspecified: Secondary | ICD-10-CM

## 2017-07-18 ENCOUNTER — Ambulatory Visit (HOSPITAL_COMMUNITY): Payer: BC Managed Care – PPO

## 2017-07-18 ENCOUNTER — Ambulatory Visit (HOSPITAL_COMMUNITY)
Admission: RE | Admit: 2017-07-18 | Discharge: 2017-07-18 | Disposition: A | Discharge status: Home / Self Care | Payer: BC Managed Care – PPO | Source: Ambulatory Visit | Attending: Obstetrics and Gynecology | Admitting: Obstetrics and Gynecology

## 2017-07-18 DIAGNOSIS — N979 Female infertility, unspecified: Secondary | ICD-10-CM

## 2017-07-18 MED ORDER — IOPAMIDOL (ISOVUE-300) INJECTION 61%
30.0000 mL | Freq: Once | INTRAVENOUS | Status: AC | PRN
  Administered 2017-07-18: 6 mL

## 2017-12-17 LAB — OB RESULTS CONSOLE ABO/RH: RH Type: POSITIVE

## 2017-12-17 LAB — OB RESULTS CONSOLE GC/CHLAMYDIA
CHLAMYDIA, DNA PROBE: NEGATIVE
GC PROBE AMP, GENITAL: NEGATIVE

## 2017-12-17 LAB — OB RESULTS CONSOLE HEPATITIS B SURFACE ANTIGEN: Hepatitis B Surface Ag: NEGATIVE

## 2017-12-17 LAB — OB RESULTS CONSOLE RUBELLA ANTIBODY, IGM: Rubella: IMMUNE

## 2017-12-17 LAB — OB RESULTS CONSOLE ANTIBODY SCREEN: ANTIBODY SCREEN: NEGATIVE

## 2017-12-17 LAB — OB RESULTS CONSOLE HIV ANTIBODY (ROUTINE TESTING): HIV: NONREACTIVE

## 2017-12-17 LAB — OB RESULTS CONSOLE RPR: RPR: NONREACTIVE

## 2018-01-31 ENCOUNTER — Encounter (HOSPITAL_COMMUNITY): Payer: Self-pay

## 2018-02-12 ENCOUNTER — Other Ambulatory Visit: Payer: Self-pay

## 2018-03-05 ENCOUNTER — Other Ambulatory Visit (HOSPITAL_COMMUNITY): Payer: Self-pay | Admitting: Obstetrics & Gynecology

## 2018-03-05 DIAGNOSIS — Z3689 Encounter for other specified antenatal screening: Secondary | ICD-10-CM

## 2018-03-05 DIAGNOSIS — Z3A19 19 weeks gestation of pregnancy: Secondary | ICD-10-CM

## 2018-03-13 ENCOUNTER — Encounter (HOSPITAL_COMMUNITY): Payer: Self-pay | Admitting: *Deleted

## 2018-03-13 ENCOUNTER — Other Ambulatory Visit (HOSPITAL_COMMUNITY): Payer: Self-pay | Admitting: Obstetrics & Gynecology

## 2018-03-13 DIAGNOSIS — O289 Unspecified abnormal findings on antenatal screening of mother: Secondary | ICD-10-CM

## 2018-03-14 ENCOUNTER — Encounter (HOSPITAL_COMMUNITY): Payer: Self-pay

## 2018-03-14 ENCOUNTER — Other Ambulatory Visit (HOSPITAL_COMMUNITY): Payer: Self-pay | Admitting: Obstetrics & Gynecology

## 2018-03-14 ENCOUNTER — Ambulatory Visit (HOSPITAL_COMMUNITY)
Admission: RE | Admit: 2018-03-14 | Discharge: 2018-03-14 | Disposition: A | Discharge status: Home / Self Care | Payer: BC Managed Care – PPO | Source: Ambulatory Visit | Attending: Obstetrics & Gynecology | Admitting: Obstetrics & Gynecology

## 2018-03-14 ENCOUNTER — Other Ambulatory Visit (HOSPITAL_COMMUNITY): Payer: Self-pay | Admitting: *Deleted

## 2018-03-14 ENCOUNTER — Other Ambulatory Visit: Payer: Self-pay

## 2018-03-14 DIAGNOSIS — O4440 Low lying placenta NOS or without hemorrhage, unspecified trimester: Secondary | ICD-10-CM

## 2018-03-14 DIAGNOSIS — O2692 Pregnancy related conditions, unspecified, second trimester: Secondary | ICD-10-CM

## 2018-03-14 DIAGNOSIS — O3412 Maternal care for benign tumor of corpus uteri, second trimester: Secondary | ICD-10-CM

## 2018-03-14 DIAGNOSIS — Z363 Encounter for antenatal screening for malformations: Secondary | ICD-10-CM | POA: Insufficient documentation

## 2018-03-14 DIAGNOSIS — Z3A18 18 weeks gestation of pregnancy: Secondary | ICD-10-CM | POA: Insufficient documentation

## 2018-03-14 DIAGNOSIS — D259 Leiomyoma of uterus, unspecified: Secondary | ICD-10-CM | POA: Insufficient documentation

## 2018-03-14 DIAGNOSIS — O289 Unspecified abnormal findings on antenatal screening of mother: Secondary | ICD-10-CM | POA: Diagnosis not present

## 2018-03-14 DIAGNOSIS — O280 Abnormal hematological finding on antenatal screening of mother: Secondary | ICD-10-CM | POA: Insufficient documentation

## 2018-03-14 DIAGNOSIS — Z315 Encounter for genetic counseling: Secondary | ICD-10-CM | POA: Diagnosis not present

## 2018-03-14 DIAGNOSIS — O4442 Low lying placenta NOS or without hemorrhage, second trimester: Secondary | ICD-10-CM | POA: Diagnosis not present

## 2018-03-14 HISTORY — DX: Trichomoniasis, unspecified: A59.9

## 2018-03-14 HISTORY — DX: Endometriosis, unspecified: N80.9

## 2018-03-14 HISTORY — DX: Benign neoplasm of connective and other soft tissue, unspecified: D21.9

## 2018-03-14 HISTORY — DX: Other ovarian cyst, unspecified side: N83.299

## 2018-03-14 NOTE — Progress Notes (Signed)
Genetic Counseling  High-Risk Gestation Note  Appointment Date:  03/14/2018 Referred By: Sanjuana Kava, MD Date of Birth:  August 23, 1986 Partner:  Noe Gens   Pregnancy History: G1P0 Estimated Date of Delivery: 08/13/18 Estimated Gestational Age: [redacted]w[redacted]d Attending: Griffin Dakin, MD   Ms. Ebony Valentine and her partner, Mr. Ebony Valentine, were seen for consultation for genetic counseling because of an elevated MSAFP of 2.78 MoMs based on maternal serum screening through [employer].    In summary:  Discussed elevated MSAFP   Reviewed possible explanations for elevation  Discussed additional options  Ultrasound- performed today; within normal limits  Amniocentesis- declined  Discussed associations with unexplained elevated MSAFP  Follow-up ultrasound scheduled 04/25/18  Reviewed family history concerns  Discussed carrier screening options- declined  We reviewed Ms. Ebony Valentine's maternal serum screening result, the elevation of MSAFP (2.78 MoM), and the associated 1 in 344 risk for a fetal open neural tube defect.   We reviewed open neural tube defects including: the typical multifactorial etiology and variable prognosis.  In addition, we discussed alternative explanations for an elevated MSAFP including: normal variation, twins, feto-maternal bleeding, a gestational dating error, abdominal wall defects, kidney differences, oligohydramnios, and placental problems.  We discussed that an unexplained elevation of MSAFP is associated with an increased risk for third trimester complications including: prematurity, low birth weight, and pre-eclampsia.    We reviewed additional available screening and diagnostic options including detailed ultrasound and amniocentesis.  We discussed the risks, limitations, and benefits of each.  After thoughtful consideration of these options, Ms. Ebony Valentine elected to have ultrasound, but declined amniocentesis.  She understands that  ultrasound cannot rule out all birth defects or genetic syndromes.  However, she was counseled that approximately 90% of fetuses with open neural tube defects can be detected by detailed second trimester ultrasound, when well visualized.  A complete ultrasound was performed today. Visualized fetal anatomy was within normal limits. The ultrasound report will be sent under separate cover.  Ms. Ebony Valentine was provided with written information regarding cystic fibrosis (CF), spinal muscular atrophy (SMA) and hemoglobinopathies including the carrier frequency, availability of carrier screening and prenatal diagnosis if indicated.  In addition, we discussed that CF and hemoglobinopathies are routinely screened for as part of the [employer] newborn screening panel. She previously had hemoglobin electrophoresis, which was within normal limits.  After further discussion, she declined screening for CF and SMA.  Both family histories were reviewed and found to be noncontributory for birth defects, intellectual disability, and known genetic conditions. The couple each reported African American ancestry. There is no known consanguinity.  Without further information regarding the provided family history, an accurate genetic risk cannot be calculated. Further genetic counseling is warranted if more information is obtained.  Ms. Ebony Valentine denied exposure to environmental toxins or chemical agents. She denied the use of alcohol, tobacco or street drugs. She denied significant viral illnesses during the course of her pregnancy.   I counseled this couple for approximately 25 minutes regarding the above risks and available options.    Chipper Oman, MS,  Certified Genetic Counselor 03/14/2018

## 2018-03-24 ENCOUNTER — Encounter (HOSPITAL_COMMUNITY): Payer: Self-pay | Admitting: *Deleted

## 2018-03-24 ENCOUNTER — Inpatient Hospital Stay (HOSPITAL_COMMUNITY)
Admission: AD | Admit: 2018-03-24 | Discharge: 2018-03-24 | Disposition: A | Discharge status: Home / Self Care | Payer: BC Managed Care – PPO | Source: Ambulatory Visit | Attending: Obstetrics & Gynecology | Admitting: Obstetrics & Gynecology

## 2018-03-24 DIAGNOSIS — O26892 Other specified pregnancy related conditions, second trimester: Secondary | ICD-10-CM | POA: Insufficient documentation

## 2018-03-24 DIAGNOSIS — O99342 Other mental disorders complicating pregnancy, second trimester: Secondary | ICD-10-CM | POA: Insufficient documentation

## 2018-03-24 DIAGNOSIS — Z3A19 19 weeks gestation of pregnancy: Secondary | ICD-10-CM | POA: Insufficient documentation

## 2018-03-24 DIAGNOSIS — R42 Dizziness and giddiness: Secondary | ICD-10-CM | POA: Insufficient documentation

## 2018-03-24 DIAGNOSIS — O3482 Maternal care for other abnormalities of pelvic organs, second trimester: Secondary | ICD-10-CM | POA: Diagnosis not present

## 2018-03-24 DIAGNOSIS — Z79891 Long term (current) use of opiate analgesic: Secondary | ICD-10-CM | POA: Insufficient documentation

## 2018-03-24 DIAGNOSIS — R55 Syncope and collapse: Secondary | ICD-10-CM

## 2018-03-24 DIAGNOSIS — F419 Anxiety disorder, unspecified: Secondary | ICD-10-CM | POA: Insufficient documentation

## 2018-03-24 DIAGNOSIS — Z833 Family history of diabetes mellitus: Secondary | ICD-10-CM | POA: Insufficient documentation

## 2018-03-24 DIAGNOSIS — O280 Abnormal hematological finding on antenatal screening of mother: Secondary | ICD-10-CM

## 2018-03-24 DIAGNOSIS — Z91013 Allergy to seafood: Secondary | ICD-10-CM | POA: Diagnosis not present

## 2018-03-24 DIAGNOSIS — Z79899 Other long term (current) drug therapy: Secondary | ICD-10-CM | POA: Insufficient documentation

## 2018-03-24 DIAGNOSIS — Z9889 Other specified postprocedural states: Secondary | ICD-10-CM | POA: Insufficient documentation

## 2018-03-24 DIAGNOSIS — F410 Panic disorder [episodic paroxysmal anxiety] without agoraphobia: Secondary | ICD-10-CM

## 2018-03-24 LAB — URINALYSIS, ROUTINE W REFLEX MICROSCOPIC
Bilirubin Urine: NEGATIVE
Glucose, UA: NEGATIVE mg/dL
HGB URINE DIPSTICK: NEGATIVE
KETONES UR: NEGATIVE mg/dL
LEUKOCYTES UA: NEGATIVE
Nitrite: NEGATIVE
PROTEIN: NEGATIVE mg/dL
Specific Gravity, Urine: 1.006 (ref 1.005–1.030)
pH: 6 (ref 5.0–8.0)

## 2018-03-24 LAB — CBC
HCT: 32.6 % — ABNORMAL LOW (ref 36.0–46.0)
HEMOGLOBIN: 11.2 g/dL — AB (ref 12.0–15.0)
MCH: 31.8 pg (ref 26.0–34.0)
MCHC: 34.4 g/dL (ref 30.0–36.0)
MCV: 92.6 fL (ref 78.0–100.0)
Platelets: 269 10*3/uL (ref 150–400)
RBC: 3.52 MIL/uL — AB (ref 3.87–5.11)
RDW: 13.6 % (ref 11.5–15.5)
WBC: 12 10*3/uL — AB (ref 4.0–10.5)

## 2018-03-24 LAB — BASIC METABOLIC PANEL
ANION GAP: 8 (ref 5–15)
BUN: 8 mg/dL (ref 6–20)
CALCIUM: 9.4 mg/dL (ref 8.9–10.3)
CO2: 24 mmol/L (ref 22–32)
Chloride: 105 mmol/L (ref 101–111)
Creatinine, Ser: 0.63 mg/dL (ref 0.44–1.00)
GLUCOSE: 93 mg/dL (ref 65–99)
POTASSIUM: 4.3 mmol/L (ref 3.5–5.1)
SODIUM: 137 mmol/L (ref 135–145)

## 2018-03-24 LAB — GLUCOSE, CAPILLARY: GLUCOSE-CAPILLARY: 77 mg/dL (ref 65–99)

## 2018-03-24 LAB — TSH: TSH: 0.685 u[IU]/mL (ref 0.350–4.500)

## 2018-03-24 NOTE — MAU Provider Note (Signed)
History   32 y/o G1P0 @ 19 weeks 5 days here complaining of two episodes of lightheadedness and dizziness that happened while at work.  She had had a light breakfast and was standing talking on the phone when the symptoms started.  She felt like she was going to pass out. She felt some panic at that time too.    Patient Active Problem List   Diagnosis Date Noted  . Abnormal MSAFP (maternal serum alpha-fetoprotein), elevated 03/14/2018  . [redacted] weeks gestation of pregnancy   . TOA (tubo-ovarian abscess) 12/24/2015    Chief Complaint  Patient presents with  . Anxiety   HPI  OB History    Gravida  1   Para      Term      Preterm      AB      Living        SAB      TAB      Ectopic      Multiple      Live Births              Past Medical History:  Diagnosis Date  . Anemia   . Eczema   . Endometriosis   . Fibroid   . Functional ovarian cysts   . Trichomonas infection     Past Surgical History:  Procedure Laterality Date  . DENTAL SURGERY    . ROBOTIC ASSISTED LAPAROSCOPIC OVARIAN CYSTECTOMY Bilateral 07/11/2016   Procedure: ROBOTIC ASSISTED LAPAROSCOPIC OVARIAN CYSTECTOMY;  Surgeon: Delsa Bern, MD;  Location: [facility];  Service: Gynecology;  Laterality: Bilateral;    Family History  Problem Relation Age of Onset  . Diabetes Father     Social History   Tobacco Use  . Smoking status: Never Smoker  . Smokeless tobacco: Never Used  Substance Use Topics  . Alcohol use: Yes    Comment: wine once a wek  . Drug use: No    Allergies:  Allergies  Allergen Reactions  . Shellfish Allergy Anaphylaxis    Medications Prior to Admission  Medication Sig Dispense Refill Last Dose  . diphenhydrAMINE (BENADRYL) 25 MG tablet Take 25-50 mg by mouth daily as needed for allergies.   Past Month at Unknown time  . Prenatal Vit-Fe Fumarate-FA (PRENATAL VITAMIN PO) Take 1 tablet by mouth daily.    03/23/2018 at Unknown time  . PRESCRIPTION MEDICATION APPLY  TOPICALLY AA BID PRN for ezcema  0 03/24/2018 at Unknown time  . HYDROcodone-acetaminophen (NORCO/VICODIN) 5-325 MG tablet Take 1-2 tablets by mouth every 6 (six) hours as needed for moderate pain. (Patient not taking: Reported on 03/14/2018) 20 tablet 0 Not Taking    ROS   Constitutional: Denies fevers/chills Cardiovascular: Denies chest pain or palpitations Pulmonary: Denies coughing or wheezing Gastrointestinal: Denies nausea, vomiting or diarrhea Genitourinary: Denies pelvic pain, unusual vaginal bleeding, unusual vaginal discharge, dysuria, urgency or frequency.  Musculoskeletal: Denies muscle or joint aches and pain.  Neurology: Denies abnormal sensations such as tingling or numbness.   Physical Exam   Blood pressure 114/64, pulse 80, temperature 98.9 F (37.2 C), temperature source Oral, resp. rate 16, height 5\' 6"  (1.676 m), weight 77.1 kg (170 lb), last menstrual period 11/06/2017, SpO2 100 %. Constitutional: She is oriented to person, place, and time. She appears well-developed and well-nourished.  HENT:  Head: Normocephalic and atraumatic.  Neck: Normal range of motion.  Cardiovascular: Normal rate, regular rhythm and normal heart sounds.   Respiratory: Effort normal and breath sounds normal.  GI: Soft. Bowel sounds are normal. Genitorurinary: Gravid uterus, appropriate for gestational age.       Neurological: She is alert and oriented to person, place, and time.  Skin: Skin is warm and dry.  Psychiatric: She has a normal mood and affect. Her behavior is normal.    ED Course Patient had some tests done as below. Orthostatic blood pressures were done and were normal.    CBC    Component Value Date/Time   WBC 12.0 (H) 03/24/2018 1500   RBC 3.52 (L) 03/24/2018 1500   HGB 11.2 (L) 03/24/2018 1500   HCT 32.6 (L) 03/24/2018 1500   PLT 269 03/24/2018 1500   MCV 92.6 03/24/2018 1500   MCH 31.8 03/24/2018 1500   MCHC 34.4 03/24/2018 1500   RDW 13.6 03/24/2018 1500    LYMPHSABS 2.3 12/25/2015 0813   MONOABS 0.4 12/25/2015 0813   EOSABS 0.3 12/25/2015 0813   BASOSABS 0.0 12/25/2015 0813   BMP Latest Ref Rng & Units 03/24/2018 12/23/2015  Glucose 65 - 99 mg/dL 93 91  BUN 6 - 20 mg/dL 8 12  Creatinine 0.44 - 1.00 mg/dL 0.63 0.86  Sodium 135 - 145 mmol/L 137 135  Potassium 3.5 - 5.1 mmol/L 4.3 3.7  Chloride 101 - 111 mmol/L 105 99(L)  CO2 22 - 32 mmol/L 24 25  Calcium 8.9 - 10.3 mg/dL 9.4 9.8   03/24/18: EKG: normal EKG, normal sinus rhythm. . Bedside fetal doppler: 140  Assessment: 32 y/o P0 @ 19 weeks 5 days EGA who presented after a near syncope episode, likely due to inadequate oral intake and orthostatic changes, possible panic attack, now patient feels improved and does not have the symptoms anymore   Plan: -Adequate nutrition in pregnancy discussed.  Advised to increase protein intake -She denies having any problems with anxiety or panic attacks history.  Resources discussed, she will notify provider if with panic attacks/anxiety symptoms.   -She has upcoming office appointment and also MFM follow up appointment for abnormal afp testing.  Alinda Dooms MD.  03/24/2018 4:20 PM

## 2018-03-24 NOTE — MAU Note (Signed)
Urine in lab 

## 2018-03-24 NOTE — MAU Note (Signed)
Pt reports she was at work this am and started having "panic like attacks", felt like her heart was beating fast, dizziness, and sweating. Happened once at 9am and again at 12 noon.

## 2018-03-25 ENCOUNTER — Ambulatory Visit (HOSPITAL_COMMUNITY): Payer: BC Managed Care – PPO

## 2018-04-25 ENCOUNTER — Encounter (HOSPITAL_COMMUNITY): Payer: Self-pay

## 2018-04-25 ENCOUNTER — Other Ambulatory Visit (HOSPITAL_COMMUNITY): Payer: Self-pay | Admitting: Obstetrics and Gynecology

## 2018-04-25 ENCOUNTER — Ambulatory Visit (HOSPITAL_COMMUNITY)
Admission: RE | Admit: 2018-04-25 | Discharge: 2018-04-25 | Disposition: A | Discharge status: Home / Self Care | Payer: BC Managed Care – PPO | Source: Ambulatory Visit | Attending: Obstetrics & Gynecology | Admitting: Obstetrics & Gynecology

## 2018-04-25 DIAGNOSIS — N83209 Unspecified ovarian cyst, unspecified side: Secondary | ICD-10-CM | POA: Insufficient documentation

## 2018-04-25 DIAGNOSIS — O3412 Maternal care for benign tumor of corpus uteri, second trimester: Secondary | ICD-10-CM

## 2018-04-25 DIAGNOSIS — O281 Abnormal biochemical finding on antenatal screening of mother: Secondary | ICD-10-CM

## 2018-04-25 DIAGNOSIS — D259 Leiomyoma of uterus, unspecified: Secondary | ICD-10-CM | POA: Diagnosis not present

## 2018-04-25 DIAGNOSIS — O4440 Low lying placenta NOS or without hemorrhage, unspecified trimester: Secondary | ICD-10-CM

## 2018-04-25 DIAGNOSIS — O3482 Maternal care for other abnormalities of pelvic organs, second trimester: Secondary | ICD-10-CM | POA: Insufficient documentation

## 2018-04-25 DIAGNOSIS — Z3A24 24 weeks gestation of pregnancy: Secondary | ICD-10-CM

## 2018-04-25 DIAGNOSIS — O2690 Pregnancy related conditions, unspecified, unspecified trimester: Secondary | ICD-10-CM

## 2018-04-25 DIAGNOSIS — O4442 Low lying placenta NOS or without hemorrhage, second trimester: Secondary | ICD-10-CM | POA: Diagnosis not present

## 2018-04-25 DIAGNOSIS — Z362 Encounter for other antenatal screening follow-up: Secondary | ICD-10-CM | POA: Insufficient documentation

## 2018-07-15 LAB — OB RESULTS CONSOLE GBS: STREP GROUP B AG: NEGATIVE

## 2018-07-28 ENCOUNTER — Other Ambulatory Visit: Payer: Self-pay | Admitting: Obstetrics and Gynecology

## 2018-07-28 ENCOUNTER — Encounter (HOSPITAL_COMMUNITY): Payer: Self-pay

## 2018-07-28 ENCOUNTER — Telehealth (HOSPITAL_COMMUNITY): Payer: Self-pay | Admitting: *Deleted

## 2018-07-28 NOTE — Telephone Encounter (Signed)
Preadmission screen  

## 2018-07-29 ENCOUNTER — Encounter (HOSPITAL_COMMUNITY): Payer: Self-pay | Admitting: *Deleted

## 2018-07-29 ENCOUNTER — Observation Stay (HOSPITAL_COMMUNITY)
Admission: AD | Admit: 2018-07-29 | Discharge: 2018-07-29 | Disposition: A | Discharge status: Home / Self Care | Payer: BC Managed Care – PPO | Source: Ambulatory Visit | Attending: Obstetrics and Gynecology | Admitting: Obstetrics and Gynecology

## 2018-07-29 DIAGNOSIS — O321XX0 Maternal care for breech presentation, not applicable or unspecified: Secondary | ICD-10-CM | POA: Diagnosis not present

## 2018-07-29 DIAGNOSIS — O280 Abnormal hematological finding on antenatal screening of mother: Secondary | ICD-10-CM

## 2018-07-29 NOTE — Discharge Summary (Signed)
Obstetric Discharge Summary  Reason for Admission: breech presentation for external cephalic version Prenatal Procedures: ultrasound and NST   Hemoglobin  Date Value Ref Range Status  03/24/2018 11.2 (L) 12.0 - 15.0 g/dL Final   HCT  Date Value Ref Range Status  03/24/2018 32.6 (L) 36.0 - 46.0 % Final    Discharge Diagnoses: baby in spontaneous vertex presentation  Physical exam:   General: normal Lochia: appropriate NST: Category 1  Date: 07/29/2018 Activity: unrestricted Diet: routine Condition: stable   Instructions: follow-up this week in office for routine OB visit Discharge to: home Follow-up Information    Raegyn Renda, Katharine Look, MD. Schedule an appointment as soon as possible for a visit in 1 day(s).   Specialty:  Obstetrics and Gynecology Why:  make an appointment for this week Contact information: [address] [place] [address] [phone]             Dede Query Fletcher Rathbun MD 07/29/2018, 7:43 PM

## 2018-07-29 NOTE — Progress Notes (Signed)
S: 32 yo G1 at 37+6 weeks presenting for possible external cephalic version. Reports good FM. Has been to the chiropractor for the [employer] and believes the baby has turned  O: fetal tracing is category 1      Bedside ultrasound: vertex presentation confirmed  A: spontaneous conversion from breech to vertes  P: patient to make [place] appointment at [facility] this week

## 2018-08-06 ENCOUNTER — Encounter (HOSPITAL_COMMUNITY)
Admission: RE | Admit: 2018-08-06 | Discharge: 2018-08-06 | Disposition: A | Discharge status: Home / Self Care | Payer: BC Managed Care – PPO | Source: Ambulatory Visit | Attending: Obstetrics and Gynecology | Admitting: Obstetrics and Gynecology

## 2018-08-07 ENCOUNTER — Inpatient Hospital Stay (HOSPITAL_COMMUNITY): Admit: 2018-08-07 | Payer: BC Managed Care – PPO | Admitting: Obstetrics and Gynecology

## 2018-08-07 ENCOUNTER — Encounter (HOSPITAL_COMMUNITY): Payer: Self-pay

## 2018-08-07 SURGERY — Surgical Case
Anesthesia: Regional

## 2018-08-13 ENCOUNTER — Telehealth (HOSPITAL_COMMUNITY): Payer: Self-pay | Admitting: *Deleted

## 2018-08-13 ENCOUNTER — Encounter (HOSPITAL_COMMUNITY): Payer: Self-pay | Admitting: *Deleted

## 2018-08-13 NOTE — Telephone Encounter (Signed)
Preadmission screen  

## 2018-08-19 ENCOUNTER — Inpatient Hospital Stay (HOSPITAL_COMMUNITY)
Admission: AD | Admit: 2018-08-19 | Discharge: 2018-08-25 | DRG: 788 | Disposition: A | Discharge status: Home / Self Care | Payer: BC Managed Care – PPO | Attending: Obstetrics and Gynecology | Admitting: Obstetrics and Gynecology

## 2018-08-19 ENCOUNTER — Encounter (HOSPITAL_COMMUNITY): Payer: Self-pay

## 2018-08-19 DIAGNOSIS — O9081 Anemia of the puerperium: Secondary | ICD-10-CM | POA: Diagnosis not present

## 2018-08-19 DIAGNOSIS — O3413 Maternal care for benign tumor of corpus uteri, third trimester: Secondary | ICD-10-CM | POA: Diagnosis present

## 2018-08-19 DIAGNOSIS — Z3490 Encounter for supervision of normal pregnancy, unspecified, unspecified trimester: Secondary | ICD-10-CM | POA: Diagnosis present

## 2018-08-19 DIAGNOSIS — O480 Post-term pregnancy: Principal | ICD-10-CM | POA: Diagnosis present

## 2018-08-19 DIAGNOSIS — Z3A40 40 weeks gestation of pregnancy: Secondary | ICD-10-CM | POA: Diagnosis not present

## 2018-08-19 DIAGNOSIS — O280 Abnormal hematological finding on antenatal screening of mother: Secondary | ICD-10-CM

## 2018-08-19 DIAGNOSIS — D251 Intramural leiomyoma of uterus: Secondary | ICD-10-CM | POA: Diagnosis present

## 2018-08-19 LAB — URINALYSIS, ROUTINE W REFLEX MICROSCOPIC
BILIRUBIN URINE: NEGATIVE
Glucose, UA: NEGATIVE mg/dL
Hgb urine dipstick: NEGATIVE
KETONES UR: NEGATIVE mg/dL
Leukocytes, UA: NEGATIVE
NITRITE: NEGATIVE
PH: 7 (ref 5.0–8.0)
Protein, ur: NEGATIVE mg/dL
Specific Gravity, Urine: 1.005 (ref 1.005–1.030)

## 2018-08-19 LAB — CBC
HCT: 36.7 % (ref 36.0–46.0)
Hemoglobin: 12.5 g/dL (ref 12.0–15.0)
MCH: 32.6 pg (ref 26.0–34.0)
MCHC: 34.1 g/dL (ref 30.0–36.0)
MCV: 95.8 fL (ref 80.0–100.0)
PLATELETS: 232 10*3/uL (ref 150–400)
RBC: 3.83 MIL/uL — ABNORMAL LOW (ref 3.87–5.11)
RDW: 14.7 % (ref 11.5–15.5)
WBC: 10.3 10*3/uL (ref 4.0–10.5)
nRBC: 0 % (ref 0.0–0.2)

## 2018-08-19 LAB — TYPE AND SCREEN
ABO/RH(D): O POS
Antibody Screen: NEGATIVE

## 2018-08-19 MED ORDER — MISOPROSTOL 25 MCG QUARTER TABLET
25.0000 ug | ORAL_TABLET | ORAL | Status: AC | PRN
  Administered 2018-08-19 – 2018-08-20 (×3): 25 ug via VAGINAL
  Filled 2018-08-19 (×3): qty 1

## 2018-08-19 MED ORDER — OXYTOCIN 40 UNITS IN LACTATED RINGERS INFUSION - SIMPLE MED: 1.0000 m[IU]/min | INTRAVENOUS | Status: DC

## 2018-08-19 MED ORDER — TERBUTALINE SULFATE 1 MG/ML IJ SOLN: 0.2500 mg | Freq: Once | INTRAMUSCULAR | Status: DC | PRN

## 2018-08-19 MED ORDER — ONDANSETRON HCL 4 MG/2ML IJ SOLN: 4.0000 mg | Freq: Four times a day (QID) | INTRAMUSCULAR | Status: DC | PRN

## 2018-08-19 MED ORDER — FENTANYL CITRATE (PF) 100 MCG/2ML IJ SOLN
50.0000 ug | INTRAMUSCULAR | Status: DC | PRN
  Administered 2018-08-21 (×2): 50 ug via INTRAVENOUS
  Filled 2018-08-19: qty 2

## 2018-08-19 MED ORDER — LACTATED RINGERS IV SOLN
500.0000 mL | INTRAVENOUS | Status: DC | PRN
  Administered 2018-08-20: 500 mL via INTRAVENOUS

## 2018-08-19 MED ORDER — OXYTOCIN BOLUS FROM INFUSION: 500.0000 mL | Freq: Once | INTRAVENOUS | Status: DC

## 2018-08-19 MED ORDER — SOD CITRATE-CITRIC ACID 500-334 MG/5ML PO SOLN
30.0000 mL | ORAL | Status: DC | PRN
  Administered 2018-08-22: 30 mL via ORAL
  Filled 2018-08-19: qty 15

## 2018-08-19 MED ORDER — OXYTOCIN 40 UNITS IN LACTATED RINGERS INFUSION - SIMPLE MED
2.5000 [IU]/h | INTRAVENOUS | Status: DC
  Filled 2018-08-19: qty 1000

## 2018-08-19 MED ORDER — OXYCODONE-ACETAMINOPHEN 5-325 MG PO TABS: 1.0000 | ORAL_TABLET | ORAL | Status: DC | PRN

## 2018-08-19 MED ORDER — LIDOCAINE HCL (PF) 1 % IJ SOLN: 30.0000 mL | INTRAMUSCULAR | Status: DC | PRN

## 2018-08-19 MED ORDER — OXYCODONE-ACETAMINOPHEN 5-325 MG PO TABS: 2.0000 | ORAL_TABLET | ORAL | Status: DC | PRN

## 2018-08-19 MED ORDER — FLEET ENEMA 7-19 GM/118ML RE ENEM: 1.0000 | ENEMA | Freq: Once | RECTAL | Status: DC | PRN

## 2018-08-19 MED ORDER — LACTATED RINGERS IV SOLN
INTRAVENOUS | Status: DC
  Administered 2018-08-19 – 2018-08-22 (×7): via INTRAVENOUS

## 2018-08-19 MED ORDER — ACETAMINOPHEN 325 MG PO TABS: 650.0000 mg | ORAL_TABLET | ORAL | Status: DC | PRN

## 2018-08-19 NOTE — Anesthesia Pain Management Evaluation Note (Signed)
  CRNA Pain Management Visit Note  Patient: Ebony Valentine, 32 y.o., female  "Hello I am a member of the anesthesia team at [facility]. We have an anesthesia team available at all times to provide care throughout the hospital, including epidural management and anesthesia for C-section. I don't know your plan for the delivery whether it a natural birth, water birth, IV sedation, nitrous supplementation, doula or epidural, but we want to meet your pain goals."   1.Was your pain managed to your expectations on prior hospitalizations?   No prior hospitalizations  2.What is your expectation for pain management during this hospitalization?     Epidural  3.How can we help you reach that goal? Epidural   Record the patient's initial score and the patient's pain goal.   Pain: 0  Pain Goal: 5 [facility] wants you to be able to say your pain was always managed very well.  Kiya Eno 08/19/2018

## 2018-08-19 NOTE — MAU Note (Signed)
Was at doctor today for membrane sweep, NST, and ultrasound. States fluid was on the low side so Dr Charlesetta Garibaldi sent her over.   No pain, no bleeding, +FM

## 2018-08-19 NOTE — H&P (Signed)
Ebony Valentine is a 32 y.o. female, G1P0 at 40.6 weeks, presenting for induction nonreactive nst in office.Prenatal hx remarkable for abnormal MSAFP with panorama normal and normal anatomy US with MFM.  Pt has a anterior fibroid 7cm intramural.  Past hx of ovarian cystectomy.  Denies contraction or bleeding.  FM+  Patient Active Problem List   Diagnosis Date Noted  . Encounter for induction of labor 08/19/2018  . Breech presentation 07/29/2018  . Abnormal MSAFP (maternal serum alpha-fetoprotein), elevated 03/14/2018  . [redacted] weeks gestation of pregnancy   . TOA (tubo-ovarian abscess) 12/24/2015    History of present pregnancy: Patient entered care at 5.6 weeks.   EDC of 08/13/2018 was established by 40.6.   Anatomy scan: 20 weeks, with normal findings and a posterior placenta Additional [place] evaluations:  Normal growth Significant prenatal events:  See above Last evaluation:  today  OB History    Gravida  1   Para      Term      Preterm      AB      Living        SAB      TAB      Ectopic      Multiple      Live Births  0          Past Medical History:  Diagnosis Date  . Anemia   . Eczema   . Endometriosis   . Fibroid   . Functional ovarian cysts   . Trichomonas infection    Past Surgical History:  Procedure Laterality Date  . DENTAL SURGERY  2009   wisdom teeth removal   . ROBOTIC ASSISTED LAPAROSCOPIC OVARIAN CYSTECTOMY Bilateral 07/11/2016   Procedure: ROBOTIC ASSISTED LAPAROSCOPIC OVARIAN CYSTECTOMY;  Surgeon: Delsa Bern, MD;  Location: [facility];  Service: Gynecology;  Laterality: Bilateral;   Family History: family history includes Asthma in her father. Social History:  reports that she has never smoked. She has never used smokeless tobacco. She reports that she drank alcohol. She reports that she does not use drugs.   Prenatal Transfer Tool  Maternal Diabetes: No Genetic Screening: Abnormal:  Results: Elevated risk of Trisomy 18 Maternal  Ultrasounds/Referrals: Normal Fetal Ultrasounds or other Referrals:  Referred to Materal Fetal Medicine  Maternal Substance Abuse:  No Significant Maternal Medications:  None Significant Maternal Lab Results: None   ROS: all systems reviewed and negative except as stated above  Allergies  Allergen Reactions  . Shellfish Allergy Anaphylaxis       Blood pressure 121/71, pulse 89, temperature 98.3 F (36.8 C), temperature source Oral, resp. rate 18, height 5\' 6"  (1.676 m), weight 83.5 kg, last menstrual period 11/06/2017.  Chest clear Heart RRR without murmur Abd gravid, NT, FH appropriate Pelvic: per RN Ext: wnl  FHR: Category 1 UCs:  None  Prenatal labs: ABO, Rh: --/--/O POS (10/08 1915) Antibody: NEG (10/08 1915) Rubella:  Immune (02/05 0000) RPR: Nonreactive (02/05 0000)  HBsAg: Negative (02/05 0000)  HIV: Non-reactive (02/05 0000)  GBS: Negative (09/03 0000) Sickle cell/Hgb electrophoresis:  AA Pap:  2016 neg GC:  Neg Chlamydia:  Neg Glucola:  85 Other:   Hgb 12.1 at NOB, 10.2 at 28 weeks       Assessment/Plan: IUP at 40.6 induction for non reactive NST in office and postdates  Plan: Admit to [facility] orders Pain med/epidural prn Cytotec induction  Pleas Koch ProtheroCNM, MSN 08/19/2018, 8:44 PM

## 2018-08-20 ENCOUNTER — Other Ambulatory Visit: Payer: Self-pay

## 2018-08-20 LAB — ABO/RH: ABO/RH(D): O POS

## 2018-08-20 LAB — RPR: RPR Ser Ql: NONREACTIVE

## 2018-08-20 MED ORDER — OXYTOCIN 40 UNITS IN LACTATED RINGERS INFUSION - SIMPLE MED
1.0000 m[IU]/min | INTRAVENOUS | Status: DC
  Administered 2018-08-20: 2 m[IU]/min via INTRAVENOUS

## 2018-08-20 NOTE — Progress Notes (Signed)
Subjective: Sleeping, occ cramping  Objective: BP 106/62   Pulse 81   Temp 97.8 F (36.6 C)   Resp 18   Ht 5\' 6"  (1.676 m)   Wt 83.5 kg   LMP 11/06/2017 (Exact Date)   BMI 29.70 kg/m  No intake/output data recorded. No intake/output data recorded.  FHT: Category 1  FHT 145 accels, occ variable UC:   irregular, every 5-7 minutes SVE:   Dilation: Closed Effacement (%): Thick Station: Ballotable Exam by:: [employer], CNM Cytotec 63mcg placed in posterior fornix Assessment:  G1P0 at 41 weeks IUP induction postdates Cat 1 strip  Plan: Monitor progress Foley balloon when able  Starla Link CNM, MSN 08/20/2018, 5:10 AM

## 2018-08-20 NOTE — Progress Notes (Signed)
Ebony Valentine is a 32 y.o. G1P0 at 41w0 admitted for induction of labor due to Post dates. Due date 08/13/18 and Non-reactive NST.  Subjective: Patient is feeling mild cramping with contractions. Baby did have period of late decels which resolved with repositioning, now occasional variable and early decels. Cervix is closed, cannot insert foley at this time. Discussed starting pitocin with patient but she is hesitant and wants to discuss with husband. She is going to eat breakfast and discuss it.   Objective: Vitals:   08/20/18 0601 08/20/18 0701 08/20/18 0801 08/20/18 0905  BP: 98/71 103/65 105/67 117/78  Pulse: 84 69 76 76  Resp: 16 16  18   Temp:      TempSrc:      Weight:      Height:        FHT:  FHR: 145 bpm, variability: moderate,  accelerations:  Present,  decelerations:  Present occasional variable and early decels UC:   irregular, every 2-4 minutes SVE:   Dilation: Fingertip Effacement (%): Thick Station: -2 Exam by:: [employer] CNM  Labs: Lab Results  Component Value Date   WBC 10.3 08/19/2018   HGB 12.5 08/19/2018   HCT 36.7 08/19/2018   MCV 95.8 08/19/2018   PLT 232 08/19/2018    Assessment / Plan: Induction of labor due to nonreactive NST and postdates, progressing well on Cytotec  Labor: Progressing normally Preeclampsia:  no signs or symptoms of toxicity, intake and ouput balanced and labs stable Fetal Wellbeing:  Category II but overall reassuring Pain Control:  Labor support without medications I/D:  n/a Anticipated MOD:  NSVD  [name] 08/20/2018, 9:14 AM

## 2018-08-20 NOTE — Progress Notes (Addendum)
Ebony Valentine is a 32 y.o. G1P0 at 41w0 admitted for induction of labor due to Post dates. Due date 08/13/18 and Non-reactive NST.  Subjective: Patient is feeling mild cramping with contractions. Dr. Alesia Richards and I both attempted insertion of foley catheter for cervical ripening and our attempts were unsuccessful. Will continue to increase pitocin to 42mu/min and then hold until foley balloon can be inserted.   Objective: Vitals:   08/20/18 1301 08/20/18 1336 08/20/18 1405 08/20/18 1407  BP: 114/64 119/77  115/80  Pulse: 65 78  85  Resp: 18  16   Temp:      TempSrc:      Weight:      Height:        FHT:  FHR: 145 bpm, variability: moderate,  accelerations:  Present,  decelerations:  Present occasional variable and early decels UC:   irregular, every 2-4 minutes SVE:   Dilation: 1 Effacement (%): 30 Station: -2 Exam by:: [name] cnm  Labs: Lab Results  Component Value Date   WBC 10.3 08/19/2018   HGB 12.5 08/19/2018   HCT 36.7 08/19/2018   MCV 95.8 08/19/2018   PLT 232 08/19/2018    Assessment / Plan: Induction of labor due to postdates and nonreactive NST,  progressing well on pitocin  Labor: Progressing normally Preeclampsia:  no signs or symptoms of toxicity, intake and ouput balanced and labs stable Fetal Wellbeing:  Category II but overall reassuring Pain Control:  Labor support without medications I/D:  n/a Anticipated MOD:  NSVD  [name] 08/20/2018, 3:33 PM  ADDENDUM:  I saw and examined patient at bedside and agree with above findings, assessment and plan as outlined above by CNM [name].  Dr. Alesia Richards.  08/20/2018.

## 2018-08-20 NOTE — Progress Notes (Addendum)
Ebony Valentine is a 32 y.o. G1P0 at 41w0 admitted for induction of labor due to Post dates. Due date 08/13/18 and Non-reactive NST.  Subjective: Patient is feeling moderate pain with contractions. Cervix is very posterior and patient did not tolerate exam. Was unable to fully examine cervix as patient requested we stop before I could palpate fetal head, consider patient likely the same. Will not re-attempt foley insertion at this time.   Dr. Landry Mellow reviewed strip, recommends 562ml IV fluid bolus and discontinuation of pitocin at this time. Will restart pitocin in 1 hour if baby's heart rate is reassuring.    Objective: Vitals:   08/20/18 2001 08/20/18 2015 08/20/18 2051 08/20/18 2202  BP: 118/72 132/76 128/77 115/71  Pulse: 89 92 100 75  Resp:      Temp:      TempSrc:      Weight:      Height:        FHT:  FHR: 145 bpm, variability: moderate,  accelerations:  Present,  decelerations:  Present occasional variable and early decels. Patient did appear to have isolated prolonged decel at 0930 when up to bathroom.  UC:   irregular, every 2-4 minutes SVE:   Dilation: 1 Effacement (%): Thick Station: -3 Exam by:: CNM [employer]: Lab Results  Component Value Date   WBC 10.3 08/19/2018   HGB 12.5 08/19/2018   HCT 36.7 08/19/2018   MCV 95.8 08/19/2018   PLT 232 08/19/2018    Assessment / Plan: Induction of labor due to postdates and nonreactive NST,  progressing well on pitocin  Labor: Progressing normally Preeclampsia:  no signs or symptoms of toxicity, intake and ouput balanced and labs stable Fetal Wellbeing:  Category II but overall reassuring Pain Control:  Labor support without medications I/D:  n/a Anticipated MOD:  NSVD  [name] 08/20/2018, 11:00 PM

## 2018-08-21 ENCOUNTER — Inpatient Hospital Stay (HOSPITAL_COMMUNITY): Admission: RE | Admit: 2018-08-21 | Payer: BC Managed Care – PPO | Source: Ambulatory Visit

## 2018-08-21 MED ORDER — PHENYLEPHRINE 40 MCG/ML (10ML) SYRINGE FOR IV PUSH (FOR BLOOD PRESSURE SUPPORT): 80.0000 ug | PREFILLED_SYRINGE | INTRAVENOUS | Status: DC | PRN

## 2018-08-21 MED ORDER — DIPHENHYDRAMINE HCL 50 MG/ML IJ SOLN: 12.5000 mg | INTRAMUSCULAR | Status: DC | PRN

## 2018-08-21 MED ORDER — OXYTOCIN 40 UNITS IN LACTATED RINGERS INFUSION - SIMPLE MED
1.0000 m[IU]/min | INTRAVENOUS | Status: DC
  Administered 2018-08-21: 2 m[IU]/min via INTRAVENOUS

## 2018-08-21 MED ORDER — EPHEDRINE 5 MG/ML INJ: 10.0000 mg | INTRAVENOUS | Status: DC | PRN

## 2018-08-21 MED ORDER — PHENYLEPHRINE 40 MCG/ML (10ML) SYRINGE FOR IV PUSH (FOR BLOOD PRESSURE SUPPORT)
80.0000 ug | PREFILLED_SYRINGE | INTRAVENOUS | Status: DC | PRN
  Filled 2018-08-21: qty 10

## 2018-08-21 MED ORDER — FENTANYL 2.5 MCG/ML BUPIVACAINE 1/10 % EPIDURAL INFUSION (WH - ANES)
14.0000 mL/h | INTRAMUSCULAR | Status: DC | PRN
  Filled 2018-08-21: qty 100

## 2018-08-21 MED ORDER — TERBUTALINE SULFATE 1 MG/ML IJ SOLN
0.2500 mg | Freq: Once | INTRAMUSCULAR | Status: DC | PRN
  Filled 2018-08-21: qty 1

## 2018-08-21 MED ORDER — MISOPROSTOL 25 MCG QUARTER TABLET
25.0000 ug | ORAL_TABLET | ORAL | Status: DC | PRN
  Administered 2018-08-21: 25 ug via VAGINAL
  Filled 2018-08-21: qty 1

## 2018-08-21 MED ORDER — LACTATED RINGERS IV SOLN: 500.0000 mL | Freq: Once | INTRAVENOUS | Status: DC

## 2018-08-21 NOTE — Progress Notes (Signed)
Labor Progress Note  Bertrum Sol, 32 y.o., G1P0, with an IUP @ [redacted]w[redacted]d, presented for IOl for NR-NST and postdates. Pt has a anterior fibroid 7cm intramural.  Past hx of ovarian cystectomy.  Subjective: Pt was in bed resting in NAD. Pt just woke from her nap. Pt denies feeling any cxt now. +FM, denies leakage of fluids or vaginal bleeding.    Patient Active Problem List   Diagnosis Date Noted  . Encounter for induction of labor 08/19/2018  . Breech presentation 07/29/2018  . Abnormal MSAFP (maternal serum alpha-fetoprotein), elevated 03/14/2018  . [redacted] weeks gestation of pregnancy   . TOA (tubo-ovarian abscess) 12/24/2015   Objective: BP 116/80   Pulse 74   Temp 97.9 F (36.6 C)   Resp 18   Ht 5\' 6"  (1.676 m)   Wt 83.5 kg   LMP 11/06/2017 (Exact Date)   SpO2 100%   BMI 29.70 kg/m  I/O last 3 completed shifts: In: 2566 [I.V.:2566] Out: -  No intake/output data recorded. NST: FHR baseline 150 bpm, Variability: moderate, Accelerations:present, Decelerations:  Absent= Cat 1/Reactive CTX:  None  Uterus gravid, soft non tender, mild to palpate with contractions.    SVE:  Dilation: 0=internal & 1 external, bloody show on glove Effacement (%): 50 Station: -3 Exam by:: [facility], CNM Pitocin at (Not on)  MUn/min  Assessment:  Bertrum Sol, 32 y.o., G1P0, with an IUP @ [redacted]w[redacted]d, presented for IOl for NR-NST and postdates. Pt has a anterior fibroid 7cm intramural.  Past hx of ovarian cystectomy. Pt stable. No cervical change. Starting induction process over again due to pt body has not responded to first round of induction with Cytotec and pitocin which started on 10/08 @ 2045. Pt left on left side with peanut ball.  Patient Active Problem List   Diagnosis Date Noted  . Encounter for induction of labor 08/19/2018  . Breech presentation 07/29/2018  . Abnormal MSAFP (maternal serum alpha-fetoprotein), elevated 03/14/2018  . [redacted] weeks gestation of pregnancy   . TOA (tubo-ovarian  abscess) 12/24/2015   NICHD: Category 1  Membranes:  Intact, no s/s of infection  Induction:    Cytotec x1 on 10/08, 2045, x2 @ 0100 & 0507 on 10/09, X1 placed 4:13 PM on 10/10  Foley Bulb: Unable to Attempted internal OS closed and cervix is posterior.   Pitocin - Started at @1102  on 10/09, stopped for prolong decles at 2308 then restarted on 10/10 0149, stopped again  @ 1200 on 10/10 when it was at 73mun/min for Pitocin break.  Pain management:               IV pain management:   Nitrous: None             Epidural placement:  PRN  GBS Negative   Plan: Continue labor plan Continuous/intermittent monitoring Rest/Ambulate/Frequent position changes to facilitate fetal rotation and descent. Placed cytotec now due to cervix not ripe enough with bishop score of 2 for successful induction. Plan for foley bulb once cervix is 1cm dilated.  Will reassess with cervical exam at 2000 or earlier if necessaryPt to shower and eat now.   Restart pitocin per protocol once cervix ripened more.  Anticipate labor progression and vaginal delivery.   Md Charlesetta Garibaldi will be updated.   Noralyn Pick, NP-C, CNM, MSN 08/21/2018. 4:10 PM Late Entry

## 2018-08-21 NOTE — Progress Notes (Signed)
Ebony Valentine is a 32 y.o. G1P0 at 41w0 admitted for induction of labor due to Post dates. Due date 08/13/18 and Non-reactive NST.  Subjective: Patient asleep, complaining of mild contractions.    Objective: Vitals:   08/21/18 0410 08/21/18 0415 08/21/18 0420 08/21/18 0457  BP:    (!) 108/56  Pulse:    70  Resp:      Temp:      TempSrc:      SpO2: 99% 100% 99%   Weight:      Height:        FHT:  FHR: 145 bpm, variability: moderate,  accelerations:  Present,  decelerations:  Present rare mild to moderate variable decels.  UC:   Rare SVE:   Dilation: 1 Effacement (%): 50 Station: -3 Exam by:: CNM, [employer]: Lab Results  Component Value Date   WBC 10.3 08/19/2018   HGB 12.5 08/19/2018   HCT 36.7 08/19/2018   MCV 95.8 08/19/2018   PLT 232 08/19/2018    Assessment / Plan: Induction of labor due to postdates and nonreactive NST,  progressing well on pitocin  Labor: Progressing normally Preeclampsia:  no signs or symptoms of toxicity, intake and ouput balanced and labs stable Fetal Wellbeing:  Category II but overall reassuring  Pain Control:  Labor support without medications I/D:  n/a Anticipated MOD:  NSVD  [name] 08/21/2018, 5:35 AM

## 2018-08-21 NOTE — Progress Notes (Signed)
Labor Progress Note  Ebony Valentine, 32 y.o., G1P0, with an IUP @ [redacted]w[redacted]d, presented for IOl for NR-NST and postdates. Pt has a anterior fibroid 7cm intramural.  Past hx of ovarian cystectomy.  Subjective: Pt was in bed resting in NAD. Pt denies feeling any cxt now. +FM, denies leakage of fluids or vaginal bleeding.    Patient Active Problem List   Diagnosis Date Noted  . Encounter for induction of labor 08/19/2018  . Breech presentation 07/29/2018  . Abnormal MSAFP (maternal serum alpha-fetoprotein), elevated 03/14/2018  . [redacted] weeks gestation of pregnancy   . TOA (tubo-ovarian abscess) 12/24/2015   Objective: BP 125/64   Pulse 73   Temp 98.1 F (36.7 C)   Resp 18   Ht 5\' 6"  (1.676 m)   Wt 83.5 kg   LMP 11/06/2017 (Exact Date)   SpO2 100%   BMI 29.70 kg/m  I/O last 3 completed shifts: In: 2566 [I.V.:2566] Out: -  No intake/output data recorded. NST: FHR baseline 135 bpm, Variability: moderate, Accelerations:present, Decelerations:  Absent= Cat 1/Reactive CTX:  7-10 irregular Uterus gravid, soft non tender, mild to palpate with contractions.    SVE:  Dilation: 1.5cm Effacement (%): 60 Station: -3 Exam by:: [facility], CNM Pitocin at (Not on)  MUn/min  Risk and benefits discussed about foley bulb, pt verbally consented, pt received fentanyl before procedure, foley bulb was placed and pt tolerated well.   Assessment:  Ebony Valentine, 32 y.o., G1P0, with an IUP @ [redacted]w[redacted]d, presented for IOl for NR-NST and postdates. Pt has a anterior fibroid 7cm intramural.  Past hx of ovarian cystectomy. Pt stable. Pt made cervical change to 1.5 cm. Pt tolerated foley bulb placement and fetus tolerated well with no decels noted.   Patient Active Problem List   Diagnosis Date Noted  . Encounter for induction of labor 08/19/2018  . Breech presentation 07/29/2018  . Abnormal MSAFP (maternal serum alpha-fetoprotein), elevated 03/14/2018  . [redacted] weeks gestation of pregnancy   . TOA (tubo-ovarian  abscess) 12/24/2015   NICHD: Category 1  Membranes:  Intact, no s/s of infection  Induction:    Cytotec x1 on 10/08, 2045, x2 @ 0100 & 0507 on 10/09, X1 placed 10:02 PM on 10/10  Foley Bulb: Placed at 2130 on 10/10  Pitocin - Started at @1102  on 10/09, stopped for prolong decles at 2308 then restarted on 10/10 0149, stopped again  @ 1200 on 10/10 when it was at 73mun/min for Pitocin break.  Pain management:               IV pain management: x1 @2130  fentanyl   Nitrous: None             Epidural placement:  PRN  GBS Negative   Plan: Continue labor plan Continuous/intermittent monitoring Rest/Ambulate/Frequent position changes to facilitate fetal rotation and descent. Plan to start low dose pitocin 2x2 for max of 6 for cervical ripen, hold at 6 until foley bulb comes out then continue to increase pitocin by 2x2, anticipate fetal decent, AROM with IUPC placement.  Will reassess with cervical exam at 0400 or earlier if necessary  .  Anticipate labor progression and vaginal delivery.   Noralyn Pick, NP-C, CNM, MSN 08/21/2018. 10:02 PM Late Entry

## 2018-08-21 NOTE — Progress Notes (Signed)
Labor Progress Note  Ebony Valentine, 32 y.o., G1P0, with an IUP @ [redacted]w[redacted]d, presented for IOl for NR-NST and postdates. Pt has a anterior fibroid 7cm intramural.  Past hx of ovarian cystectomy.  Subjective: Pt ate and now is sleeping, husband at bedside would like pt to continue to sleep, RN will be notified when pt awake to continue induction. Pt in NAD.   Patient Active Problem List   Diagnosis Date Noted  . Encounter for induction of labor 08/19/2018  . Breech presentation 07/29/2018  . Abnormal MSAFP (maternal serum alpha-fetoprotein), elevated 03/14/2018  . [redacted] weeks gestation of pregnancy   . TOA (tubo-ovarian abscess) 12/24/2015   Objective: BP 116/80   Pulse 74   Temp 97.9 F (36.6 C)   Resp 18   Ht 5\' 6"  (1.676 m)   Wt 83.5 kg   LMP 11/06/2017 (Exact Date)   SpO2 100%   BMI 29.70 kg/m  I/O last 3 completed shifts: In: 2566 [I.V.:2566] Out: -  No intake/output data recorded. NST: Pt not on the monitor at this time. Cat one tracing with reactive NST was noted before pt was taken off before her shower. Pt felt fetal movement.   Pt not checked at this time, below is previous check.   SVE:  Dilation: 0=internal & 1 external Effacement (%): 50 Station: -3 Exam by:: [facility], CNM Pitocin at 16(Stopped now for Pitocin break)  MUn/min  Bedside US showed OP vertex fetus.   Assessment:  Ebony Valentine, 31 y.o., G1P0, with an IUP @ [redacted]w[redacted]d, presented for IOl for NR-NST and postdates. Pt has a anterior fibroid 7cm intramural.  Past hx of ovarian cystectomy. Pt stable and sleeping and husband request pt not be disturbed until she wakes then will restart induction process.   Patient Active Problem List   Diagnosis Date Noted  . Encounter for induction of labor 08/19/2018  . Breech presentation 07/29/2018  . Abnormal MSAFP (maternal serum alpha-fetoprotein), elevated 03/14/2018  . [redacted] weeks gestation of pregnancy   . TOA (tubo-ovarian abscess) 12/24/2015   NICHD: Category 1  before taken off the monitor With +FM.   Membranes:  Intact, no s/s of infection  Induction:    Cytotec x1 on 10/08, 2045, x2 @ 0100 & 0507 on 10/09  Foley Bulb: Attempted but internal OS closed and cervix is posterior.   Pitocin - Originally started at @1102  on 10/09, stopped for prolong decles at 2308 then restarted on 10/10 0149,  stopped again @ 1200 on 10/10 for Pitocin break was at 16.  Pain management:               IV pain management:   Nitrous: None             Epidural placement:  PRN  GBS Negative   Plan: Continue labor plan Continuous/intermittent monitoring Rest/Ambulate/Frequent position changes to facilitate fetal rotation and descent. Will reassess with cervical exam when husband calls when pt awake or earlier if necessary, plan for Cytotec if fetal strip looks good and cxts are spread apart. Plans to stop pitocin now, cervix needs more ripening, plan to place foley bulb as soon as her cervix is 1cm dilated all the way through, will restart induction with Cytotec if indicated.  Pt to shower and eat now.   Restart pitocin per protocol once cervix ripened more.  Anticipate labor progression and vaginal delivery.    Noralyn Pick, NP-C, CNM, MSN 08/21/2018. 2:41 PM Late Entry

## 2018-08-21 NOTE — Plan of Care (Signed)
  Problem: Education: Goal: Knowledge of General Education information will improve Description Including pain rating scale, medication(s)/side effects and non-pharmacologic comfort measures Outcome: Progressing   Problem: Health Behavior/Discharge Planning: Goal: Ability to manage health-related needs will improve Outcome: Progressing   Problem: Clinical Measurements: Goal: Ability to maintain clinical measurements within normal limits will improve Outcome: Progressing Goal: Will remain free from infection Outcome: Progressing Goal: Diagnostic test results will improve Outcome: Progressing Goal: Respiratory complications will improve Outcome: Progressing Goal: Cardiovascular complication will be avoided Outcome: Progressing   Problem: Activity: Goal: Risk for activity intolerance will decrease Outcome: Progressing   Problem: Nutrition: Goal: Adequate nutrition will be maintained Outcome: Progressing   Problem: Coping: Goal: Level of anxiety will decrease Outcome: Progressing   Problem: Elimination: Goal: Will not experience complications related to bowel motility Outcome: Progressing Goal: Will not experience complications related to urinary retention Outcome: Progressing   Problem: Pain Managment: Goal: General experience of comfort will improve Outcome: Progressing   Problem: Safety: Goal: Ability to remain free from injury will improve Outcome: Progressing   Problem: Skin Integrity: Goal: Risk for impaired skin integrity will decrease Outcome: Progressing   Problem: Education: Goal: Knowledge of Childbirth will improve Outcome: Progressing Goal: Ability to make informed decisions regarding treatment and plan of care will improve Outcome: Progressing Goal: Ability to state and carry out methods to decrease the pain will improve Outcome: Progressing   Problem: Coping: Goal: Ability to verbalize concerns and feelings about labor and delivery will  improve Outcome: Progressing   Problem: Life Cycle: Goal: Ability to make normal progression through stages of labor will improve Outcome: Progressing Goal: Ability to effectively push during vaginal delivery will improve Outcome: Progressing   Problem: Role Relationship: Goal: Ability to demonstrate positive interaction with the child will improve Outcome: Progressing   Problem: Safety: Goal: Risk of complications during labor and delivery will decrease Outcome: Progressing   Problem: Pain Management: Goal: Relief or control of pain from uterine contractions will improve Outcome: Progressing   

## 2018-08-21 NOTE — Progress Notes (Signed)
Labor Progress Note  Ebony Valentine, 32 y.o., G1P0, with an IUP @ [redacted]w[redacted]d, presented for IOl for NR-NST and postdates. Pt has a anterior fibroid 7cm intramural.  Past hx of ovarian cystectomy.  Subjective: I assumed care of pt and pt was resting in bed in stable condition. Pt feeling cxt, but tolerates them well, maternal perception of cxt is mild. Husband at bedside. Pt in NAD.    RN reported one prolonged decel to nadir of 120 at 0740 and resolved spontaneously with no connection to cxt.   Patient Active Problem List   Diagnosis Date Noted  . Encounter for induction of labor 08/19/2018  . Breech presentation 07/29/2018  . Abnormal MSAFP (maternal serum alpha-fetoprotein), elevated 03/14/2018  . [redacted] weeks gestation of pregnancy   . TOA (tubo-ovarian abscess) 12/24/2015   Objective: BP 110/71   Pulse 68   Temp 97.8 F (36.6 C) (Oral)   Resp 16   Ht 5\' 6"  (1.676 m)   Wt 83.5 kg   LMP 11/06/2017 (Exact Date)   SpO2 100%   BMI 29.70 kg/m  I/O last 3 completed shifts: In: 2566 [I.V.:2566] Out: -  No intake/output data recorded. NST: FHR baseline 135 bpm, Variability: moderate, Accelerations:present, Decelerations:  Absent= Cat 1/Reactive CTX:  irregular, every 3-4 minutes, lasting 50-80 seconds and moderate to palpate but mild to maternal perception  Uterus gravid, soft non tender, moderate to palpate with contractions.   Exam this time is unchanged from previous exam. My impression was that the cervix internal OS id not dilated at all. I could appreciate the external OS, as long and thick on the anterior aspect and posterior aspect was 50% effaced. Fetus was to high to appreciate sutures, but leopold revealed vertex.  SVE:  Dilation: 1 Effacement (%): 50 Station: -3 Exam by:: CNM, [facility] Pitocin at 16 mUn/min  Assessment:  Ebony Valentine, 32 y.o., G1P0, with an IUP @ [redacted]w[redacted]d, presented for IOl for NR-NST and postdates. Pt has a anterior fibroid 7cm intramural.  Past hx of ovarian  cystectomy. Pt stable. I used [place] and CAT with COW posturing to help facility fetal rotation, fetal head felt asynclitic, pt then walking and will used lunges with peanut ball to help increase fetal descent and dilation.   Patient Active Problem List   Diagnosis Date Noted  . Encounter for induction of labor 08/19/2018  . Breech presentation 07/29/2018  . Abnormal MSAFP (maternal serum alpha-fetoprotein), elevated 03/14/2018  . [redacted] weeks gestation of pregnancy   . TOA (tubo-ovarian abscess) 12/24/2015   NICHD: Category 1  Membranes:  Intact, no s/s of infection  Induction:    Cytotec x1 on 10/08, 2045, x2 @ 0100 & 0507 on 10/09  Foley Bulb: Attempted but internal OS closed and cervix is posterior.   Pitocin - 16 mUn/min, started at @1102  on 10/09, stopped for prolong decles at 2308 then restarted on 10/10 0149  Pain management:               IV pain management:   Nitrous: None             Epidural placement:  PRN  GBS Negative   Plan: Continue labor plan Continuous/intermittent monitoring Rest/Ambulate/Frequent position changes to facilitate fetal rotation and descent. Will reassess with cervical exam at 1200 or earlier if necessary Keep pitocin at 16 and stop pitocin if no cervical change at 12 for pt to eat, then will restart back.  Continue pitocin per protocol Anticipate labor progression and vaginal delivery.  Md Dillard to be updated.   Noralyn Pick, NP-C, CNM, MSN 08/21/2018. 10:39 AM Late Entry

## 2018-08-21 NOTE — Progress Notes (Signed)
RN called for one prolonged decel @ 1816 from baseline of 150s to nadir of 90  For 1.5 mins then slow rise from 120s for 2 more mins back to baseline of 150s again, spontaneous resolution of the decel, and currently there is a baseline of 150, with moderate variability and +acels, -decles noted now.

## 2018-08-21 NOTE — Progress Notes (Addendum)
Ebony Valentine is a 32 y.o. G1P0 at 41w0 admitted for induction of labor due to Post dates. Due date 08/13/18 and Non-reactive NST.  Subjective: Called to room by RN reporting prolonged decel. Decel was resolving as I arrived. Resolved with fluid bolus, oxygen, and repositioning. Heartrate returned to baseline with moderate variability. Discussed prolonged decel with patient. Patient understands induction may continue if an isolated incident but if nonreassuring fetal heart tracing develops we would recommend cesarean. Will continue to hold off on restarting pitocin until 1 hour has passed from prolonged decel, RN to restart if category I tracing at that time.    Objective: Vitals:   08/20/18 2202 08/20/18 2316 08/21/18 0014 08/21/18 0052  BP: 115/71 116/67 (!) 129/59   Pulse: 75 72 80   Resp:    16  Temp:    98 F (36.7 C)  TempSrc:    Oral  Weight:      Height:        FHT:  FHR: 145 bpm, variability: moderate,  accelerations:  Present,  decelerations:  Absent. Prolonged decel and occasional variables noted, now category 1 without decels for 30 minutes.  UC:   Rare SVE:   Dilation: 1 Effacement (%): Thick Station: -3 Exam by:: CNM [employer]: Lab Results  Component Value Date   WBC 10.3 08/19/2018   HGB 12.5 08/19/2018   HCT 36.7 08/19/2018   MCV 95.8 08/19/2018   PLT 232 08/19/2018    Assessment / Plan: Induction of labor due to postdates and nonreactive NST,  progressing well on pitocin  Labor: Progressing normally Preeclampsia:  no signs or symptoms of toxicity, intake and ouput balanced and labs stable Fetal Wellbeing:  Category II now category I, will monitor closely  Pain Control:  Labor support without medications I/D:  n/a Anticipated MOD:  NSVD  [name] 08/21/2018, 1:06 AM

## 2018-08-21 NOTE — Progress Notes (Signed)
Labor Progress Note  Ebony Valentine, 32 y.o., G1P0, with an IUP @ [redacted]w[redacted]d, presented for IOl for NR-NST and postdates. Pt has a anterior fibroid 7cm intramural.  Past hx of ovarian cystectomy.  Subjective: Pt wa sin bed on all four performing cat and cow excrescens with pelvis rolls, pt just came back from long walk and stated walking makes her feel her contractions more. Pt feeling cxt, but tolerates them well, maternal perception of cxt is mild. Husband at bedside. Pt in NAD.   Patient Active Problem List   Diagnosis Date Noted  . Encounter for induction of labor 08/19/2018  . Breech presentation 07/29/2018  . Abnormal MSAFP (maternal serum alpha-fetoprotein), elevated 03/14/2018  . [redacted] weeks gestation of pregnancy   . TOA (tubo-ovarian abscess) 12/24/2015   Objective: BP 116/80   Pulse 74   Temp 97.9 F (36.6 C)   Resp 18   Ht 5\' 6"  (1.676 m)   Wt 83.5 kg   LMP 11/06/2017 (Exact Date)   SpO2 100%   BMI 29.70 kg/m  I/O last 3 completed shifts: In: 2566 [I.V.:2566] Out: -  No intake/output data recorded. NST: FHR baseline 140 bpm, Variability: moderate, Accelerations:present, Decelerations:  Absent= Cat 1/Reactive CTX:  irregular, every 2 minutes, lasting 50-80 seconds and mild to palpate but mild to maternal perception  Uterus gravid, soft non tender, mild to palpate with contractions.    SVE:  Dilation: 0=internal & 1 external Effacement (%): 50 Station: -3 Exam by:: [facility], CNM Pitocin at 16(Stopped now for Pitocin break)  MUn/min  Bedside US showed OP vertex fetus.   Assessment:  Ebony Valentine, 32 y.o., G1P0, with an IUP @ [redacted]w[redacted]d, presented for IOl for NR-NST and postdates. Pt has a anterior fibroid 7cm intramural.  Past hx of ovarian cystectomy. Pt stable. Fetus OP per bedside US. Pt hungry and stable. No cervical change.   Patient Active Problem List   Diagnosis Date Noted  . Encounter for induction of labor 08/19/2018  . Breech presentation 07/29/2018  .  Abnormal MSAFP (maternal serum alpha-fetoprotein), elevated 03/14/2018  . [redacted] weeks gestation of pregnancy   . TOA (tubo-ovarian abscess) 12/24/2015   NICHD: Category 1  Membranes:  Intact, no s/s of infection  Induction:    Cytotec x1 on 10/08, 2045, x2 @ 0100 & 0507 on 10/09  Foley Bulb: Attempted but internal OS closed and cervix is posterior.   Pitocin - was 16 mUn/min, started at @1102  on 10/09, stopped for prolong decles at 2308 then restarted on 10/10 0149,  stopped again now @ 1200 on 10/10 for Pitocin break  Pain management:               IV pain management:   Nitrous: None             Epidural placement:  PRN  GBS Negative   Plan: Continue labor plan Continuous/intermittent monitoring Rest/Ambulate/Frequent position changes to facilitate fetal rotation and descent. Will reassess with cervical exam at 1400 or earlier if necessary, plan for Cytotec if fetal strip looks good and cxts are spread apart. Plans to stop pitocin now, cervix needs more ripening, plan to place foley bulb as soon as her cervix is 1cm dilated all the way through, will restart induction with Cytotec if indicated.  Pt to shower and eat now.   Restart pitocin per protocol once cervix ripened more.  Anticipate labor progression and vaginal delivery.   Md Dillard updated.   Noralyn Pick, NP-C, CNM, MSN 08/21/2018.  1:42 PM Late Entry

## 2018-08-22 ENCOUNTER — Inpatient Hospital Stay (HOSPITAL_COMMUNITY): Payer: BC Managed Care – PPO | Admitting: Anesthesiology

## 2018-08-22 ENCOUNTER — Encounter (HOSPITAL_COMMUNITY): Payer: Self-pay | Admitting: Anesthesiology

## 2018-08-22 ENCOUNTER — Encounter (HOSPITAL_COMMUNITY)
Admission: AD | Disposition: A | Discharge status: Home / Self Care | Payer: Self-pay | Source: Home / Self Care | Attending: Obstetrics and Gynecology

## 2018-08-22 SURGERY — Surgical Case
Anesthesia: Spinal

## 2018-08-22 MED ORDER — MORPHINE SULFATE (PF) 0.5 MG/ML IJ SOLN
INTRAMUSCULAR | Status: AC
  Filled 2018-08-22: qty 10

## 2018-08-22 MED ORDER — METHYLERGONOVINE MALEATE 0.2 MG/ML IJ SOLN
INTRAMUSCULAR | Status: AC
  Filled 2018-08-22: qty 1

## 2018-08-22 MED ORDER — CEFAZOLIN SODIUM-DEXTROSE 2-3 GM-%(50ML) IV SOLR
INTRAVENOUS | Status: DC | PRN
  Administered 2018-08-22: 2 g via INTRAVENOUS

## 2018-08-22 MED ORDER — OXYTOCIN 40 UNITS IN LACTATED RINGERS INFUSION - SIMPLE MED: 2.5000 [IU]/h | INTRAVENOUS | Status: AC

## 2018-08-22 MED ORDER — KETOROLAC TROMETHAMINE 30 MG/ML IJ SOLN
INTRAMUSCULAR | Status: AC
  Filled 2018-08-22: qty 1

## 2018-08-22 MED ORDER — MEPERIDINE HCL 25 MG/ML IJ SOLN: 6.2500 mg | INTRAMUSCULAR | Status: DC | PRN

## 2018-08-22 MED ORDER — DEXAMETHASONE SODIUM PHOSPHATE 10 MG/ML IJ SOLN
INTRAMUSCULAR | Status: AC
  Filled 2018-08-22: qty 1

## 2018-08-22 MED ORDER — SIMETHICONE 80 MG PO CHEW
80.0000 mg | CHEWABLE_TABLET | Freq: Three times a day (TID) | ORAL | Status: DC
  Administered 2018-08-22 – 2018-08-25 (×7): 80 mg via ORAL
  Filled 2018-08-22 (×7): qty 1

## 2018-08-22 MED ORDER — METHYLERGONOVINE MALEATE 0.2 MG PO TABS: 0.2000 mg | ORAL_TABLET | ORAL | Status: DC | PRN

## 2018-08-22 MED ORDER — LACTATED RINGERS IV SOLN
INTRAVENOUS | Status: DC
  Administered 2018-08-22: 12:00:00 via INTRAVENOUS

## 2018-08-22 MED ORDER — KETOROLAC TROMETHAMINE 30 MG/ML IJ SOLN
30.0000 mg | Freq: Four times a day (QID) | INTRAMUSCULAR | Status: AC | PRN
  Administered 2018-08-22: 30 mg via INTRAMUSCULAR

## 2018-08-22 MED ORDER — WITCH HAZEL-GLYCERIN EX PADS: 1.0000 "application " | MEDICATED_PAD | CUTANEOUS | Status: DC | PRN

## 2018-08-22 MED ORDER — IBUPROFEN 600 MG PO TABS
600.0000 mg | ORAL_TABLET | Freq: Four times a day (QID) | ORAL | Status: DC
  Administered 2018-08-22 – 2018-08-25 (×13): 600 mg via ORAL
  Filled 2018-08-22 (×12): qty 1

## 2018-08-22 MED ORDER — FENTANYL CITRATE (PF) 100 MCG/2ML IJ SOLN
INTRAMUSCULAR | Status: DC | PRN
  Administered 2018-08-22: 15 ug via INTRATHECAL

## 2018-08-22 MED ORDER — OXYCODONE HCL 5 MG PO TABS
5.0000 mg | ORAL_TABLET | ORAL | Status: DC | PRN
  Administered 2018-08-23 – 2018-08-25 (×5): 5 mg via ORAL
  Filled 2018-08-22 (×5): qty 1

## 2018-08-22 MED ORDER — MENTHOL 3 MG MT LOZG: 1.0000 | LOZENGE | OROMUCOSAL | Status: DC | PRN

## 2018-08-22 MED ORDER — ONDANSETRON HCL 4 MG/2ML IJ SOLN
INTRAMUSCULAR | Status: AC
  Filled 2018-08-22: qty 2

## 2018-08-22 MED ORDER — OXYTOCIN 10 UNIT/ML IJ SOLN
INTRAVENOUS | Status: DC | PRN
  Administered 2018-08-22: 40 [IU] via INTRAVENOUS

## 2018-08-22 MED ORDER — BUPIVACAINE IN DEXTROSE 0.75-8.25 % IT SOLN
INTRATHECAL | Status: DC | PRN
  Administered 2018-08-22: 1.8 mL via INTRATHECAL

## 2018-08-22 MED ORDER — DIPHENHYDRAMINE HCL 25 MG PO CAPS: 25.0000 mg | ORAL_CAPSULE | Freq: Four times a day (QID) | ORAL | Status: DC | PRN

## 2018-08-22 MED ORDER — DIBUCAINE 1 % RE OINT: 1.0000 "application " | TOPICAL_OINTMENT | RECTAL | Status: DC | PRN

## 2018-08-22 MED ORDER — SCOPOLAMINE 1 MG/3DAYS TD PT72
MEDICATED_PATCH | TRANSDERMAL | Status: AC
  Filled 2018-08-22: qty 1

## 2018-08-22 MED ORDER — BUPIVACAINE IN DEXTROSE 0.75-8.25 % IT SOLN
INTRATHECAL | Status: AC
  Filled 2018-08-22: qty 2

## 2018-08-22 MED ORDER — PHENYLEPHRINE 8 MG IN D5W 100 ML (0.08MG/ML) PREMIX OPTIME
INJECTION | INTRAVENOUS | Status: DC | PRN
  Administered 2018-08-22: 60 ug/min via INTRAVENOUS

## 2018-08-22 MED ORDER — ONDANSETRON HCL 4 MG/2ML IJ SOLN: 4.0000 mg | Freq: Three times a day (TID) | INTRAMUSCULAR | Status: DC | PRN

## 2018-08-22 MED ORDER — SODIUM CHLORIDE 0.9 % IV SOLN: 2.0000 g | INTRAVENOUS | Status: DC

## 2018-08-22 MED ORDER — TETANUS-DIPHTH-ACELL PERTUSSIS 5-2.5-18.5 LF-MCG/0.5 IM SUSP: 0.5000 mL | Freq: Once | INTRAMUSCULAR | Status: DC

## 2018-08-22 MED ORDER — OXYTOCIN 10 UNIT/ML IJ SOLN
INTRAMUSCULAR | Status: AC
  Filled 2018-08-22: qty 4

## 2018-08-22 MED ORDER — GLYCOPYRROLATE 0.2 MG/ML IJ SOLN
INTRAMUSCULAR | Status: DC | PRN
  Administered 2018-08-22: 0.1 mg via INTRAVENOUS

## 2018-08-22 MED ORDER — SODIUM CHLORIDE 0.9 % IR SOLN
Status: DC | PRN
  Administered 2018-08-22: 1

## 2018-08-22 MED ORDER — SCOPOLAMINE 1 MG/3DAYS TD PT72
MEDICATED_PATCH | TRANSDERMAL | Status: DC | PRN
  Administered 2018-08-22: 1 via TRANSDERMAL

## 2018-08-22 MED ORDER — KETOROLAC TROMETHAMINE 30 MG/ML IJ SOLN: 30.0000 mg | Freq: Four times a day (QID) | INTRAMUSCULAR | Status: AC | PRN

## 2018-08-22 MED ORDER — DIPHENHYDRAMINE HCL 50 MG/ML IJ SOLN
INTRAMUSCULAR | Status: AC
  Filled 2018-08-22: qty 1

## 2018-08-22 MED ORDER — FENTANYL CITRATE (PF) 100 MCG/2ML IJ SOLN: 25.0000 ug | INTRAMUSCULAR | Status: DC | PRN

## 2018-08-22 MED ORDER — LACTATED RINGERS IV SOLN
INTRAVENOUS | Status: DC | PRN
  Administered 2018-08-22: 06:00:00 via INTRAVENOUS

## 2018-08-22 MED ORDER — SENNOSIDES-DOCUSATE SODIUM 8.6-50 MG PO TABS
2.0000 | ORAL_TABLET | ORAL | Status: DC
  Administered 2018-08-23 – 2018-08-24 (×3): 2 via ORAL
  Filled 2018-08-22 (×3): qty 2

## 2018-08-22 MED ORDER — NALOXONE HCL 0.4 MG/ML IJ SOLN: 0.4000 mg | INTRAMUSCULAR | Status: DC | PRN

## 2018-08-22 MED ORDER — FENTANYL CITRATE (PF) 100 MCG/2ML IJ SOLN
INTRAMUSCULAR | Status: AC
  Filled 2018-08-22: qty 2

## 2018-08-22 MED ORDER — PRENATAL MULTIVITAMIN CH
1.0000 | ORAL_TABLET | Freq: Every day | ORAL | Status: DC
  Administered 2018-08-22 – 2018-08-25 (×4): 1 via ORAL
  Filled 2018-08-22 (×4): qty 1

## 2018-08-22 MED ORDER — COCONUT OIL OIL
1.0000 "application " | TOPICAL_OIL | Status: DC | PRN
  Administered 2018-08-22: 1 via TOPICAL
  Filled 2018-08-22 (×2): qty 120

## 2018-08-22 MED ORDER — SIMETHICONE 80 MG PO CHEW
80.0000 mg | CHEWABLE_TABLET | ORAL | Status: DC
  Administered 2018-08-23 – 2018-08-24 (×2): 80 mg via ORAL
  Filled 2018-08-22 (×2): qty 1

## 2018-08-22 MED ORDER — INFLUENZA VAC SPLIT QUAD 0.5 ML IM SUSY: 0.5000 mL | PREFILLED_SYRINGE | INTRAMUSCULAR | Status: DC

## 2018-08-22 MED ORDER — SIMETHICONE 80 MG PO CHEW: 80.0000 mg | CHEWABLE_TABLET | ORAL | Status: DC | PRN

## 2018-08-22 MED ORDER — MORPHINE SULFATE (PF) 0.5 MG/ML IJ SOLN
INTRAMUSCULAR | Status: DC | PRN
  Administered 2018-08-22: .15 mg via INTRATHECAL

## 2018-08-22 MED ORDER — METHYLERGONOVINE MALEATE 0.2 MG/ML IJ SOLN: 0.2000 mg | INTRAMUSCULAR | Status: DC | PRN

## 2018-08-22 MED ORDER — ACETAMINOPHEN 325 MG PO TABS
650.0000 mg | ORAL_TABLET | ORAL | Status: DC | PRN
  Administered 2018-08-23: 650 mg via ORAL
  Filled 2018-08-22: qty 2

## 2018-08-22 MED ORDER — ZOLPIDEM TARTRATE 5 MG PO TABS: 5.0000 mg | ORAL_TABLET | Freq: Every evening | ORAL | Status: DC | PRN

## 2018-08-22 MED ORDER — OXYCODONE HCL 5 MG PO TABS
10.0000 mg | ORAL_TABLET | ORAL | Status: DC | PRN
  Filled 2018-08-22: qty 2

## 2018-08-22 MED ORDER — DIPHENHYDRAMINE HCL 50 MG/ML IJ SOLN
INTRAMUSCULAR | Status: DC | PRN
  Administered 2018-08-22: 25 mg via INTRAVENOUS

## 2018-08-22 MED ORDER — EPHEDRINE SULFATE 50 MG/ML IJ SOLN
INTRAMUSCULAR | Status: DC | PRN
  Administered 2018-08-22 (×3): 5 mg via INTRAVENOUS

## 2018-08-22 MED ORDER — SODIUM CHLORIDE 0.9% FLUSH: 3.0000 mL | INTRAVENOUS | Status: DC | PRN

## 2018-08-22 SURGICAL SUPPLY — 35 items
BARRIER ADHS 3X4 INTERCEED (GAUZE/BANDAGES/DRESSINGS) ×3 IMPLANT
BENZOIN TINCTURE PRP APPL 2/3 (GAUZE/BANDAGES/DRESSINGS) ×3 IMPLANT
CHLORAPREP W/TINT 26ML (MISCELLANEOUS) ×3 IMPLANT
CLAMP CORD UMBIL (MISCELLANEOUS) IMPLANT
CLOSURE WOUND 1/2 X4 (GAUZE/BANDAGES/DRESSINGS) ×1
CLOTH BEACON ORANGE TIMEOUT ST (SAFETY) ×3 IMPLANT
DERMABOND ADVANCED (GAUZE/BANDAGES/DRESSINGS)
DERMABOND ADVANCED .7 DNX12 (GAUZE/BANDAGES/DRESSINGS) IMPLANT
DRSG OPSITE POSTOP 4X10 (GAUZE/BANDAGES/DRESSINGS) ×3 IMPLANT
ELECT REM PT RETURN 9FT ADLT (ELECTROSURGICAL) ×3
ELECTRODE REM PT RTRN 9FT ADLT (ELECTROSURGICAL) ×1 IMPLANT
EXTRACTOR VACUUM BELL STYLE (SUCTIONS) IMPLANT
GLOVE BIO SURGEON STRL SZ7 (GLOVE) ×3 IMPLANT
GLOVE BIOGEL PI IND STRL 7.0 (GLOVE) ×2 IMPLANT
GLOVE BIOGEL PI INDICATOR 7.0 (GLOVE) ×4
GOWN STRL REUS W/TWL LRG LVL3 (GOWN DISPOSABLE) ×6 IMPLANT
KIT ABG SYR 3ML LUER SLIP (SYRINGE) ×3 IMPLANT
NEEDLE HYPO 25X5/8 SAFETYGLIDE (NEEDLE) ×3 IMPLANT
NS IRRIG 1000ML POUR BTL (IV SOLUTION) ×3 IMPLANT
PACK C SECTION WH (CUSTOM PROCEDURE TRAY) ×3 IMPLANT
PAD OB MATERNITY 4.3X12.25 (PERSONAL CARE ITEMS) ×3 IMPLANT
PENCIL SMOKE EVAC W/HOLSTER (ELECTROSURGICAL) ×3 IMPLANT
RTRCTR C-SECT PINK 25CM LRG (MISCELLANEOUS) ×3 IMPLANT
SPONGE LAP 18X18 RF (DISPOSABLE) ×9 IMPLANT
STRIP CLOSURE SKIN 1/2X4 (GAUZE/BANDAGES/DRESSINGS) ×2 IMPLANT
SUT CHROMIC 0 CTX 36 (SUTURE) IMPLANT
SUT MON AB 4-0 PS1 27 (SUTURE) ×3 IMPLANT
SUT PLAIN 0 NONE (SUTURE) IMPLANT
SUT PLAIN 2 0 XLH (SUTURE) ×3 IMPLANT
SUT VIC AB 0 CTX 36 (SUTURE) ×10
SUT VIC AB 0 CTX36XBRD ANBCTRL (SUTURE) ×5 IMPLANT
SUT VIC AB 2-0 CT1 27 (SUTURE) ×2
SUT VIC AB 2-0 CT1 TAPERPNT 27 (SUTURE) ×1 IMPLANT
TOWEL OR 17X24 6PK STRL BLUE (TOWEL DISPOSABLE) ×3 IMPLANT
TRAY FOLEY W/BAG SLVR 14FR LF (SET/KITS/TRAYS/PACK) IMPLANT

## 2018-08-22 NOTE — Progress Notes (Signed)
  In to consent pt for primary c-section due to fetal intolerance to labor.  Upon my arrival, deceleration noted with contraction but variability noted afterwards.  Pt has h/o Stage 4 Endometriosis but states she did not have a lot of pain.  BP 113/64   Pulse 78   Temp 98.3 F (36.8 C) (Oral)   Resp 18   Ht 5\' 6"  (1.676 m)   Wt 83.5 kg   LMP 11/06/2017 (Exact Date)   SpO2 100%   BMI 29.70 kg/m   Gen:  NAD Abdomen: Fibroid palpated at umbilicus.  Ultrasound done.  OP presentation.  No fibroid noted at LUS.  D/w pt and husband risk of c-section including increased bleeding, infection, injury to abdominal organs.  Discussed increased risk of adhesions due to endometriosis.  Informed of low possibility of hysterectomy or blood transfusion.  Pt is not opposed to receiving blood.  Informed that I will have another surgeon starting the surgery with me.  All questions answered.

## 2018-08-22 NOTE — Transfer of Care (Signed)
Immediate Anesthesia Transfer of Care Note  Patient: Ebony Valentine  Procedure(s) Performed: CESAREAN SECTION (N/A )  Patient Location: PACU  Anesthesia Type:Spinal  Level of Consciousness: awake, alert  and oriented  Airway & Oxygen Therapy: Patient Spontanous Breathing  Post-op Assessment: Report given to RN and Post -op Vital signs reviewed and stable  Post vital signs: Reviewed and stable  Last Vitals:  Vitals Value Taken Time  BP 134/58 08/22/2018  7:16 AM  Temp    Pulse 91 08/22/2018  7:22 AM  Resp 18 08/22/2018  7:22 AM  SpO2 96 % 08/22/2018  7:22 AM  Vitals shown include unvalidated device data.  Last Pain:  Vitals:   08/22/18 0036  TempSrc: Oral  PainSc:       Patients Stated Pain Goal: Other (Comment)(per pt unsure at this time) (03/52/48 1859)  Complications: No apparent anesthesia complications

## 2018-08-22 NOTE — Lactation Note (Signed)
This note was copied from a baby's chart. Lactation Consultation Note  Patient Name: Ebony Valentine [id number] Date: 08/22/2018 Reason for consult: Initial assessment;Primapara;1st time breastfeeding;NICU baby;Term  Visited with P60 Mom of term baby at 69 hrs old.  Baby in the NICU due to need for respiratory support.  Mom resting in bed.  Mom states she would like to breastfeed.  Talked with her about the importance of double pumping and breast massage and hand expression as soon after birth as she can.  Explained about the benefits of early initiation of milk expression affecting her milk supply.  Mom nodded.  FOB arrived with food for lunch.  Offered to show Mom how to use breast massage and hand expression.  Mom asked if she could wait until later as family and friends in room, and she would like to eat. Encouraged  >8 double pumping in 24 hrs.  Talked about importance of comfortable pumping, nipple moving freely in flange.  Instructed Mom to call for assistance with first pumping to assess proper fit.   Provided colostrum Snappies for hand expression, explained how swabbing babies mouth with colostrum can be done even if baby is NPO.   Reviewed cleaning of pump parts with parents.  2 basins provided for washing and drying.    Mom to ask for assistance prn.   NICU booklet and Lactation brochure provided to Mom.  Reassured her that we would support her to reach her goals with breastfeeding.    Interventions Interventions: Breast feeding basics reviewed;Skin to skin;Breast massage;Hand express;DEBP  Lactation Tools Discussed/Used Pump Review: Setup, frequency, and cleaning;Milk Storage Initiated by:: RN Date initiated:: 08/22/18   Consult Status Consult Status: Follow-up Date: 08/23/18 Follow-up type: In-patient    Ebony Valentine 08/22/2018, 12:40 PM

## 2018-08-22 NOTE — Anesthesia Postprocedure Evaluation (Signed)
Anesthesia Post Note  Patient: Aneisha Skyles  Procedure(s) Performed: CESAREAN SECTION (N/A )     Patient location during evaluation: PACU Anesthesia Type: Spinal Level of consciousness: oriented and awake and alert Pain management: pain level controlled Vital Signs Assessment: post-procedure vital signs reviewed and stable Respiratory status: spontaneous breathing, respiratory function stable and patient connected to nasal cannula oxygen Cardiovascular status: blood pressure returned to baseline and stable Postop Assessment: no headache, no backache, no apparent nausea or vomiting, spinal receding and patient able to bend at knees Anesthetic complications: no    Last Vitals:  Vitals:   08/22/18 0800 08/22/18 0815  BP: 105/67   Pulse: 72 70  Resp: 15 15  Temp: 36.4 C   SpO2: 94% 95%    Last Pain:  Vitals:   08/22/18 0815  TempSrc:   PainSc: 0-No pain   Pain Goal: Patients Stated Pain Goal: Other (Comment)(per pt unsure at this time) (08/20/18 1931)               Effie Berkshire

## 2018-08-22 NOTE — Progress Notes (Signed)
Dr. Margie Billet at beside educating patient about risks and benefits of C-section. Signed consent at this time.

## 2018-08-22 NOTE — Anesthesia Preprocedure Evaluation (Signed)
Anesthesia Evaluation  Patient identified by MRN, date of birth, ID band Patient awake    Reviewed: Allergy & Precautions, Patient's Chart, lab work & pertinent test results  Airway Mallampati: II  TM Distance: >3 FB Neck ROM: Full    Dental no notable dental hx. (+) Teeth Intact   Pulmonary neg pulmonary ROS,    Pulmonary exam normal breath sounds clear to auscultation       Cardiovascular negative cardio ROS Normal cardiovascular exam Rhythm:Regular Rate:Normal     Neuro/Psych negative neurological ROS  negative psych ROS   GI/Hepatic Neg liver ROS, GERD  ,  Endo/Other  negative endocrine ROS  Renal/GU negative Renal ROS  negative genitourinary   Musculoskeletal Eczema   Abdominal   Peds  Hematology  (+) anemia ,   Anesthesia Other Findings   Reproductive/Obstetrics (+) Pregnancy Fibroid uterus Hx/o endometriosis Fetal intolerance to labor                             Anesthesia Physical Anesthesia Plan  ASA: II and emergent  Anesthesia Plan: Spinal   Post-op Pain Management:    Induction:   PONV Risk Score and Plan: Ondansetron, Dexamethasone, Treatment may vary due to age or medical condition and Scopolamine patch - Pre-op  Airway Management Planned: Natural Airway  Additional Equipment:   Intra-op Plan:   Post-operative Plan:   Informed Consent: I have reviewed the patients History and Physical, chart, labs and discussed the procedure including the risks, benefits and alternatives for the proposed anesthesia with the patient or authorized representative who has indicated his/her understanding and acceptance.   Dental advisory given  Plan Discussed with: Surgeon and CRNA  Anesthesia Plan Comments:         Anesthesia Quick Evaluation

## 2018-08-22 NOTE — Op Note (Signed)
Ebony Valentine, Ebony Valentine MEDICAL RECORD [id number] ACCOUNT [id number] DATE OF BIRTH:27-Aug-1986 FACILITY: [facility] LOCATION: [id number] PHYSICIAN:Roxann Vierra Al Decant, MD  OPERATIVE REPORT  DATE OF PROCEDURE:  08/22/2018  PREOPERATIVE DIAGNOSES:  Intrauterine pregnancy at 26 and 2/7 weeks, fetal intolerance to labor, fibroid, history of stage IV endometriosis.  POSTOPERATIVE DIAGNOSES:  Intrauterine pregnancy at 36 and 2/7 weeks, fetal intolerance to labor, fibroid, history of stage IV endometriosis, thick meconium.  PROCEDURE:  Primary low transverse cesarean section with two layer closure.  SURGEON:  Thurnell Lose, MD  ASSISTANT:  Dr. Geralyn Flash and [facility], certified nurse midwife (Dr. Geralyn Flash was called in due to patient's history of fibroid and a stage IV endometriosis in anticipation of adhesions).  ANESTHESIA:  Epidural and spinal.  ESTIMATED BLOOD LOSS:  508  DRAINS:  Foley catheter.  SPECIMEN:  Placenta.  DISPOSITION OF SPECIMEN:  To pathology.  PATIENT DISPOSITION:  To PACU hemodynamically stable.  FINDINGS:  Viable female infant in the vertex position, thick meconium noted.  Short umbilical cord.  Normal appearing placenta.  Apgars 8, 8 and 8, weight was 6 pounds 15 ounces.  Left fallopian adhesed to bowel.  INDICATIONS:  The patient was admitted 3 days prior to the delivery for postdates induction.  Several measures were taken including Cytotec and Pitocin.  The induction was discontinued.  The patient was allowed to rest in the evening prior to section.   Foley bulb was placed and low dose Pitocin was started.  Baby started to have prolonged decels with contractions.  Variability was present with a recurrence of decelerations and just with the Foley bulb in place.  Pitocin was stopped and the patient was  counseled to proceed with primary C-section to which she was agreeable.  DESCRIPTION OF PROCEDURE:  The patient was identified.  She went to the operating room after she was  counseled on the risks, benefits and alternatives of the procedure.  It was known prior to the surgery that she had a large anterior fibroid and stage IV  endometriosis and the patient was counseled appropriately of potential risk.  She underwent spinal epidural anesthesia without complication.  She was placed in the dorsal lithotomy position in leftward tilt.  A Foley catheter was placed.  SCDs were on  her legs and operating.  She was prepped.  Ancef 2 grams IV was called.  Timeout was performed and then she was draped after adequate anesthesia was confirmed.  Pfannenstiel skin incision was made on the abdomen after it was marked.  After adequate anesthesia, skin incision was made with the scalpel and carried down to the underlying layer of the fascia with hemostasis as needed.  Fascia was incised at the  midline and extended laterally.  Curved Kocher clamps were used to grasp the fascia and the fascia was dissected sharply off of the rectus muscles.  The fascia of the rectus muscles were separated at the midline with a hemostat.  Peritoneum was  identified and entered sharply with the Metzenbaum scissors and bluntly.  The uterus was then palpated and the fibroid was closer to the fundus.  It was not in the lower uterine segment.  Alexis retractor was placed.  Attention was turned to the lower  uterine segment.  Bladder flap was developed with the Russian's and Metzenbaum scissors that was displaced posteriorly.  A transverse incision was then made and extended with the bandage scissors.  The lower uterine segment was thin, so the Allis clamp  was used to grasp or pull the  myometrium to so we could get down to the amnion.  Meconium was noted.  The head was brought to the incision and suctioned.  Shoulders were delivered easily.  Baby was suctioned off the field and appeared to be moving.  Cord  was clamped x2 and cut.  Baby handed off to the awaiting NICU team.  A spontaneous cry was noted.  There were  a few sinuses that were open so ring forceps were used to grasp the hysterotomy edges.  The placenta was somewhat difficult to remove.  The cord avulsed in an attempt to remove it, but I was able to manually remove the placenta and then the  uterus was cleared of all clots and debris x2 with a moist laparotomy sponge.  Once it was noted that there were no significant adhesions and that the bleeding was minimal and fibroid was out of the way.  I relieved Dr. Geralyn Flash of his assistance and [facility], the midwife, completed the procedure with me.  Hysterotomy incision  was closed with 0 Vicryl in a continuous locked fashion.  Second layer of the same suture was used.  Copious irrigation was performed of the abdomen.  There were some adhesions of the bowel to the left fallopian tube, so I was not able to see the left  ovary.  There were some minimal adhesions on the right, but I could feel the normal ovary and fallopian tube.  Interceed was then applied to the lower uterine segment and the Alexis retractor was then removed.  Once that was removed, I saw bleeding at  the hysterotomy incision.  The Interceed was then removed and a figure-of-eight suture was placed at the site of bleeding and the bleeding stopped.  Interceed was then replaced.  The peritoneum was closed with 2-0 Vicryl in a continuous fashion.  The fascia was then reapproximated with 0 Vicryl in a continuous fashion.  Subcutaneous space was reapproximated with 2-0 plain gut in a continuous fashion and the skin was  reapproximated with 4-0 Monocryl in a subcuticular fashion.  Before we closed every layer, we made sure that each layer was hemostatic and copiously irrigated.  I guess the tag on the Monocryl was cut short, so the incisions began to open up from the right hand side.  I came back to the field and closed the incision with 4-0 Monocryl again and I tied the ends together.  Steri-Strips and benzoin were to be  applied.  All  instrument, sponge and needle counts were correct x3.  The patient tolerated the procedure well.  Baby went to the NICU due to need of support.  TN/NUANCE  D:08/22/2018 T:08/22/2018 JOB:[id number]

## 2018-08-22 NOTE — Progress Notes (Signed)
Labor Progress Note  Bertrum Sol, 32 y.o., G1P0, with an IUP @ [redacted]w[redacted]d, presented for IOl for NR-NST and postdates. Pt has a anterior fibroid 7cm intramural.  Past hx of ovarian cystectomy.  Subjective: Pt was in bed resting in NAD. I was in room for cervical exam and witnessed a prolong decle @ 0400, RN report another decel occurred earlier x4 mins and resolved with position change right side.   @0326  prolonged decel x4 mins nadir of 120 with baseline of 145 @0400  prolonged decel x11 mins nadir of 60 with baseline of 145 @0418  x1 variable with a  nadir of 80 with baseline of 145 @0436  prolonged decel x3 mins nadir of 120 with baseline of 145 @0454  prolonged decel x4 mins nadir of 90 with baseline of 150  Patient Active Problem List   Diagnosis Date Noted  . Encounter for induction of labor 08/19/2018  . Breech presentation 07/29/2018  . Abnormal MSAFP (maternal serum alpha-fetoprotein), elevated 03/14/2018  . [redacted] weeks gestation of pregnancy   . TOA (tubo-ovarian abscess) 12/24/2015   Objective: BP 113/64   Pulse 78   Temp 98.3 F (36.8 C) (Oral)   Resp 18   Ht 5\' 6"  (1.676 m)   Wt 83.5 kg   LMP 11/06/2017 (Exact Date)   SpO2 100%   BMI 29.70 kg/m  I/O last 3 completed shifts: In: 2566 [I.V.:2566] Out: -  No intake/output data recorded. NST: FHR baseline 145 bpm, Variability: moderate, Accelerations:present, Decelerations:  Prolonged decelx4 with x1 variable, see subjective for detail of decles.= Cat 2/Reactive CTX:  2-9 irregular, lasting 60-120 second Uterus gravid, soft non tender, mild to palpate with contractions.    SVE:  Dilation: 1.5cm Effacement (%): 60 Station: -3 Exam by:: [facility], CNM Pitocin at (6)  MUn/min  Foley bulb in Place with no change.     Assessment:  Bertrum Sol, 32 y.o., G1P0, with an IUP @ [redacted]w[redacted]d, presented for IOl for NR-NST and postdates. Pt has a anterior fibroid 7cm intramural.  Past hx of ovarian cystectomy. Pt stable. Pt has  not made any cervical change, left on left side with peanut ball, with baseline strip of fetus at 145s. Dr Simona Huh was called @ 0405 and made aware of prolonged decel and consulted @ 0421  about pt case and prolonged decels, discussed risk and benefit, will proceed with a primary LTCS for fetal intolerance to induction. Dr Simona Huh aware of 7cm anterior uterine fibroid and H/O endometriosis. At [place] charge nurse made of CS, at 0500 Dr Simona Huh present in house and prepping for cs.    Patient Active Problem List   Diagnosis Date Noted  . Encounter for induction of labor 08/19/2018  . Breech presentation 07/29/2018  . Abnormal MSAFP (maternal serum alpha-fetoprotein), elevated 03/14/2018  . [redacted] weeks gestation of pregnancy   . TOA (tubo-ovarian abscess) 12/24/2015   NICHD: Category 2 (Reolved with stopping the pitocin, position change, fluid IV bolus, [place] O2)  Membranes:  Intact, no s/s of infection  Induction:    Cytotec x1 on 10/08, 2045, x2 @ 0100 & 0507 on 10/09, X1 placed 4:59 AM on 10/10  Foley Bulb: Placed at 2130 on 10/10  Pitocin - Started at @1102  on 10/09, stopped for prolong decles at 2308 then restarted on 10/10 0149, stopped again  @ 1200 on 10/10 when it was at 16mun/min for Pitocin break. Restarted pit again at 2300 on 10/10 maxed at 6, stopped  at 0401 for prolonged decel.   Pain  management:               IV pain management: x1 @2130  fentanyl   Nitrous: None             Epidural placement:  PRN  GBS Negative   Plan: SCD Routine pre-op orders Continuous/intermittent monitoring Rest on right side with peanut ball.   Anticipate Primary c/s for fetal intolerance to induction  [facility], NP-C, CNM, MSN 08/22/2018. 4:59 AM Late Entry

## 2018-08-22 NOTE — Brief Op Note (Signed)
08/19/2018 - 08/22/2018  7:22 AM  PATIENT:  Ebony Valentine  32 y.o. female  PRE-OPERATIVE DIAGNOSIS:  IUP @ 41 2/7 weeks,  fetal intolerance to labor, Fibroid, h/o Stage 4 Endometriosis  POST-OPERATIVE DIAGNOSIS:  Same, Thick meconium  PROCEDURE:  Procedure(s): CESAREAN SECTION (N/A), Primary LTCS, 2 layer closure  SURGEON:  Surgeon(s) and Role:    Thurnell Lose, MD - Primary  PHYSICIAN ASSISTANT:   ASSISTANTS: Dr. Geralyn Flash, [facility], [place]   ANESTHESIA:   epidural and spinal  EBL:  508 mL   BLOOD ADMINISTERED:none  DRAINS: Urinary Catheter (Foley)   LOCAL MEDICATIONS USED:  NONE  SPECIMEN:  Source of Specimen:  Placenta  DISPOSITION OF SPECIMEN:  PATHOLOGY  COUNTS:  YES  TOURNIQUET:  * No tourniquets in log *  DICTATION: .Other Dictation: Dictation Number [id number]  PLAN OF CARE: Transfer to postpartum after PACU  PATIENT DISPOSITION:  PACU - hemodynamically stable.   Delay start of Pharmacological VTE agent (>24hrs) due to surgical blood loss or risk of bleeding: not applicable

## 2018-08-22 NOTE — Anesthesia Postprocedure Evaluation (Signed)
Anesthesia Post Note  Patient: Ebony Valentine  Procedure(s) Performed: CESAREAN SECTION (N/A )     Patient location during evaluation: Mother Baby Anesthesia Type: Spinal and Epidural Level of consciousness: awake and alert Pain management: pain level controlled Vital Signs Assessment: post-procedure vital signs reviewed and stable Respiratory status: spontaneous breathing, nonlabored ventilation and respiratory function stable Cardiovascular status: stable Postop Assessment: no headache, no backache and epidural receding Anesthetic complications: no    Last Vitals:  Vitals:   08/22/18 0815 08/22/18 0847  BP:  (!) 83/58  Pulse: 70 65  Resp: 15 18  Temp:  36.5 C  SpO2: 95% 100%    Last Pain:  Vitals:   08/22/18 1000  TempSrc:   PainSc: 0-No pain   Pain Goal: Patients Stated Pain Goal: Other (Comment)(per pt unsure at this time) (08/20/18 1931)               Barkley Boards

## 2018-08-22 NOTE — Anesthesia Procedure Notes (Addendum)
Spinal  Patient location during procedure: OR Start time: 08/22/2018 5:40 AM End time: 08/22/2018 5:50 AM Staffing Anesthesiologist: Josephine Igo, MD Performed: anesthesiologist  Preanesthetic Checklist Completed: patient identified, site marked, surgical consent, pre-op evaluation, timeout performed, IV checked, risks and benefits discussed and monitors and equipment checked Spinal Block Patient position: sitting Prep: site prepped and draped and DuraPrep Patient monitoring: heart rate, cardiac monitor, continuous pulse ox and blood pressure Approach: midline Location: L3-4 Injection technique: single-shot Needle Needle type: Spinocan and Tuohy  Needle gauge: 25 G Needle length: 9 cm Needle insertion depth: 6 cm Catheter at skin depth: 11 cm Assessment Sensory level: T4 Additional Notes Epidural performed using 17Ga Touhy needle, LOR with air. No heme, CSF or paresthesias. SAB performed with 25Ga Spinocan needle. Epidural catheter threaded 5cm into the epidural space. Epidural needle withdrawn and sterile dressing applied. Patient tolerated procedure well. Adequate sensory level.

## 2018-08-23 ENCOUNTER — Encounter (HOSPITAL_COMMUNITY): Payer: Self-pay | Admitting: Obstetrics and Gynecology

## 2018-08-23 LAB — CBC
HCT: 26.4 % — ABNORMAL LOW (ref 36.0–46.0)
Hemoglobin: 9.1 g/dL — ABNORMAL LOW (ref 12.0–15.0)
MCH: 33.1 pg (ref 26.0–34.0)
MCHC: 34.5 g/dL (ref 30.0–36.0)
MCV: 96 fL (ref 80.0–100.0)
Platelets: 185 10*3/uL (ref 150–400)
RBC: 2.75 MIL/uL — ABNORMAL LOW (ref 3.87–5.11)
RDW: 14.6 % (ref 11.5–15.5)
WBC: 14.6 10*3/uL — AB (ref 4.0–10.5)
nRBC: 0 % (ref 0.0–0.2)

## 2018-08-23 MED ORDER — NALOXONE HCL 4 MG/10ML IJ SOLN
1.0000 ug/kg/h | INTRAVENOUS | Status: DC | PRN
  Filled 2018-08-23: qty 5

## 2018-08-23 MED ORDER — NALBUPHINE HCL 10 MG/ML IJ SOLN: 5.0000 mg | INTRAMUSCULAR | Status: DC | PRN

## 2018-08-23 MED ORDER — DIPHENHYDRAMINE HCL 50 MG/ML IJ SOLN: 12.5000 mg | INTRAMUSCULAR | Status: DC | PRN

## 2018-08-23 MED ORDER — FERROUS SULFATE 325 (65 FE) MG PO TABS
325.0000 mg | ORAL_TABLET | Freq: Every day | ORAL | Status: DC
  Administered 2018-08-24 – 2018-08-25 (×2): 325 mg via ORAL
  Filled 2018-08-23 (×2): qty 1

## 2018-08-23 MED ORDER — NALBUPHINE HCL 10 MG/ML IJ SOLN: 5.0000 mg | Freq: Once | INTRAMUSCULAR | Status: DC | PRN

## 2018-08-23 MED ORDER — DIPHENHYDRAMINE HCL 25 MG PO CAPS: 25.0000 mg | ORAL_CAPSULE | ORAL | Status: DC | PRN

## 2018-08-23 NOTE — Progress Notes (Addendum)
Subjective: Postpartum Day 1: Cesarean Delivery Patient reports incisional pain, tolerating PO and no problems voiding.   Baby in NICU for meconium aspiration.  Discussed breastfeeding, incision care, and pain relief options with patient and family.  Mild asymptomatic anemia.   Objective: Vitals:   08/22/18 1950 08/23/18 0020 08/23/18 0401 08/23/18 0842  BP: 106/78 108/64 107/61 113/62  Pulse: 75 64 81 67  Resp: 18 16 16 18   Temp: 98 F (36.7 C) (!) 97.5 F (36.4 C) 98.3 F (36.8 C)   TempSrc: Oral Oral    SpO2: 100% 100% 98% 100%  Weight:      Height:        Physical Exam:  General: alert and cooperative Lochia: appropriate Uterine Fundus: firm Incision: healing well, no significant drainage, no dehiscence, no significant erythema DVT Evaluation: No evidence of DVT seen on physical exam. Negative Homan's sign. No cords or calf tenderness. No significant calf/ankle edema.  Recent Labs    08/23/18 0500  HGB 9.1*  HCT 26.4*    Assessment/Plan: Status post Cesarean section. Doing well postoperatively.  Continue current care. Start PO Iron QD for mild asymptomatic anemia  [name] 08/23/2018, 8:45 AM Attestation of Attending Supervision of Advanced Practitioner (CNM/NP): Evaluation and management procedures were performed by the Advanced Practitioner under my supervision and collaboration.  I have reviewed the Advanced Practitioner's note and chart, and I agree with the management and plan. I saw and examined patient at bedside and agree with above findings, assessment and plan as outlined above by CNM [name]. Dr. Alesia Richards.  08/23/2018.

## 2018-08-24 NOTE — Progress Notes (Signed)
Subjective: Postpartum Day 2: Cesarean Delivery Patient reports tolerating PO and no problems voiding.   Denies N/V.  Baby in NICU on 3L O2.  Pt requesting lactation consult when she goes to feed baby at 2p.  Objective: Vital signs in last 24 hours: Temp:  [97.7 F (36.5 C)-99.1 F (37.3 C)] 98.8 F (37.1 C) (10/13 0800) Pulse Rate:  [65-80] 80 (10/13 0800) Resp:  [16-18] 18 (10/13 0800) BP: (105-129)/(55-83) 105/65 (10/13 0800) SpO2:  [100 %] 100 % (10/13 0800)  Physical Exam:  General: alert and no distress Lochia: appropriate Uterine Fundus: firm Incision: healing well, dressing intact with dried stable stain on dressing DVT Evaluation: No evidence of DVT seen on physical exam.  Recent Labs    08/23/18 0500  HGB 9.1*  HCT 26.4*    Assessment/Plan: Status post Cesarean section. Doing well postoperatively.  Continue current care. Anticipate d/c tomorrow.  Delice Lesch 08/24/2018, 10:03 AM

## 2018-08-24 NOTE — Progress Notes (Signed)
Called Ebony Valentine in lactation to request she meet mom in NICU for 1400 feed.

## 2018-08-24 NOTE — Lactation Note (Signed)
This note was copied from a baby's chart. Lactation Consultation Note  Patient Name: Ebony Valentine [id number] Date: 08/24/2018   RN assisting mom with breastfeeding on arrival.  This is moms first attempt at trying to put him to the breast.  Infant not rooting.  Assist with try to rouse him and intice him to eat.  Mom able to hand express and rub a little on hip lips. infant opened and attempted to latch a few times.  Not able to maintain for more than a few sucks.  Left mom and baby sts.  Urged mom to call for assist as needed.  Maternal Data    Feeding Feeding Type: Formula  LATCH Score                   Interventions Interventions: Breast feeding basics reviewed;Assisted with latch;Skin to skin  Lactation Tools Discussed/Used     Consult Status      [name] 08/24/2018, 3:53 PM

## 2018-08-25 DIAGNOSIS — O9081 Anemia of the puerperium: Secondary | ICD-10-CM

## 2018-08-25 MED ORDER — OXYCODONE HCL 5 MG PO TABS: 5.0000 mg | ORAL_TABLET | ORAL | 0 refills | Status: DC | PRN

## 2018-08-25 MED ORDER — FERROUS SULFATE 325 (65 FE) MG PO TABS: 325.0000 mg | ORAL_TABLET | Freq: Two times a day (BID) | ORAL | 3 refills | Status: DC

## 2018-08-25 MED ORDER — IBUPROFEN 600 MG PO TABS: 600.0000 mg | ORAL_TABLET | Freq: Four times a day (QID) | ORAL | 0 refills | Status: DC

## 2018-08-25 NOTE — Discharge Summary (Signed)
Primary CS OB Discharge Summary     Patient Name: Ebony Valentine DOB: 04-18-1986 MRN: [id number]  Date of admission: 08/19/2018 Delivering MD: Thurnell Lose  Date of delivery: 08/22/2018 Type of delivery: Primary CS  Newborn Data: Sex: Baby Female Circumcision: Needs in pt circ before discharge, Baby Female in NICU for meconium aspiration.  Live born female  Birth Weight: 6 lb 15.1 oz (3150 g) APGAR: 8, 8  Newborn Delivery   Birth date/time:  08/22/2018 06:07:00 Delivery type:  C-Section, Low Transverse Trial of labor:  Yes C-section categorization:  Primary     Feeding: breast Infant being discharge to home with mother in stable condition.   Admitting diagnosis: REFERRED BY DOCTOR Intrauterine pregnancy: [redacted]w[redacted]d     Secondary diagnosis:  Active Problems:   Encounter for induction of labor   Normal postpartum course   Postpartum anemia                                Complications: None                                                              Intrapartum Procedures: cesarean: low cervical, transverse Postpartum Procedures: none Complications-Operative and Postpartum: Anemia Augmentation: Pitocin, Cytotec and Foley Balloon   History of Present Illness: Ebony Valentine is a 32 y.o. female, G1P1001, who presents at [redacted]w[redacted]d weeks gestation. The patient has been followed at  [facility]  Her pregnancy has been complicated by:  Patient Active Problem List   Diagnosis Date Noted  . Normal postpartum course 08/25/2018  . Postpartum anemia 08/25/2018  . Encounter for induction of labor 08/19/2018  . Breech presentation 07/29/2018  . Abnormal MSAFP (maternal serum alpha-fetoprotein), elevated 03/14/2018  . [redacted] weeks gestation of pregnancy   . TOA (tubo-ovarian abscess) 12/24/2015   Hospital Course--Unscheduled Cesarean:  Admitted 08/19/2018. Negative GBS.  Utilized Spinal epidural for pain management.   Pt was IOL for postdates and  non-reassurring NST, possible oligohydramnios with 3%.  Due to non-reassuring FHT, she was consented for cesarean, with Dr. Simona Huh performing a Primary LTCS under spinal epidural anesthesia, with delivery of a viable female, with weight and Apgars as listed below. Infant was in good condition and remained at the patient's bedside.  The patient was taken to recovery in good condition.  Patient planned to Breast/Pump feed.  On post-op day 1, patient was doing well, tolerating a regular diet, with Hgb of 9.1.  Throughout her stay, her physical exam was WNL, her incision was CDI, and her vital signs remained stable.  By post-op day 2, she was up ad lib, tolerating a regular diet, with good pain control with po med.  She was deemed to have received the full benefit of her hospital stay, and was discharged home in stable condition.  Contraceptive choice was undecided. Pain controlled with PO meds. Pt denies ha, vision changes, or RUQ pain. BP stable. Infant in NICU with possible discharge tomorrow for meconium aspiration. Dressing change order placed for husband request. 25% of dressing has old blood on it. Pt has asymptomatic anemia.     Physical exam  Vitals:   08/24/18 1944 08/24/18 2331 08/25/18 0450 08/25/18 0809  BP:  122/75 110/68 112/63 120/71  Pulse: 82 75 72 80  Resp: 16 16 16 18   Temp: 97.8 F (36.6 C) 98.6 F (37 C) 98 F (36.7 C)   TempSrc: Oral Oral Oral   SpO2: 100% 100% 100% 100%  Weight:      Height:       General: alert, cooperative and no distress Lochia: appropriate Uterine Fundus: firm Incision: Healing well with no significant drainage, honeycomb dressing CDI, 25% old dried blood present on lower honeycomb. Perineum: Intact DVT Evaluation: No evidence of DVT seen on physical exam. Negative Homan's sign. No cords or calf tenderness. No significant calf/ankle edema.  Labs: Lab Results  Component Value Date   WBC 14.6 (H) 08/23/2018   HGB 9.1 (L) 08/23/2018   HCT 26.4 (L)  08/23/2018   MCV 96.0 08/23/2018   PLT 185 08/23/2018   CMP Latest Ref Rng & Units 03/24/2018  Glucose 65 - 99 mg/dL 93  BUN 6 - 20 mg/dL 8  Creatinine 0.44 - 1.00 mg/dL 0.63  Sodium 135 - 145 mmol/L 137  Potassium 3.5 - 5.1 mmol/L 4.3  Chloride 101 - 111 mmol/L 105  CO2 22 - 32 mmol/L 24  Calcium 8.9 - 10.3 mg/dL 9.4  Total Protein 6.5 - 8.1 g/dL -  Total Bilirubin 0.3 - 1.2 mg/dL -  Alkaline Phos 38 - 126 U/L -  AST 15 - 41 U/L -  ALT 14 - 54 U/L -    Date of discharge: 08/25/2018 Discharge Diagnoses: Post-date pregnancy and Primary CS due mto fetal intolerance of induction  Discharge instruction: per After Visit Summary and "Baby and Me Booklet".  After visit meds:  Allergies as of 08/25/2018      Reactions   Shellfish Allergy Anaphylaxis      Medication List    TAKE these medications   diphenhydrAMINE 25 MG tablet Commonly known as:  BENADRYL Take 25-50 mg by mouth daily as needed for allergies.   ferrous sulfate 325 (65 FE) MG tablet Take 1 tablet (325 mg total) by mouth 2 (two) times daily with a meal.   ibuprofen 600 MG tablet Commonly known as:  ADVIL,MOTRIN Take 1 tablet (600 mg total) by mouth every 6 (six) hours.   oxyCODONE 5 MG immediate release tablet Commonly known as:  Oxy IR/ROXICODONE Take 1 tablet (5 mg total) by mouth every 4 (four) hours as needed (pain scale 4-7).   prenatal multivitamin Tabs tablet Take 1 tablet by mouth at bedtime.   PRESCRIPTION MEDICATION APPLY TOPICALLY AA BID PRN for ezcema            Discharge Care Instructions  (From admission, onward)         Start     Ordered   08/25/18 0000  Discharge wound care:    Comments:  Take dressing off on day 5-7 postpartum.  Report increased drainage, redness or warmth. Clean with water, let soap trickle down body. Can leave steri strips on until they fall off or take them off gently at day 10. Keep open to air, clean and dry.   08/25/18 1034          Activity:            pelvic rest Advance as tolerated. Pelvic rest for 6 weeks.  Diet:                routine Medications: PNV, Ibuprofen, Colace, Iron and Percocet Postpartum contraception: Undecided Condition:  Pt discharge to home with baby  in stable Anemia: IRON  Meds: Allergies as of 08/25/2018      Reactions   Shellfish Allergy Anaphylaxis      Medication List    TAKE these medications   diphenhydrAMINE 25 MG tablet Commonly known as:  BENADRYL Take 25-50 mg by mouth daily as needed for allergies.   ferrous sulfate 325 (65 FE) MG tablet Take 1 tablet (325 mg total) by mouth 2 (two) times daily with a meal.   ibuprofen 600 MG tablet Commonly known as:  ADVIL,MOTRIN Take 1 tablet (600 mg total) by mouth every 6 (six) hours.   oxyCODONE 5 MG immediate release tablet Commonly known as:  Oxy IR/ROXICODONE Take 1 tablet (5 mg total) by mouth every 4 (four) hours as needed (pain scale 4-7).   prenatal multivitamin Tabs tablet Take 1 tablet by mouth at bedtime.   PRESCRIPTION MEDICATION APPLY TOPICALLY AA BID PRN for ezcema            Discharge Care Instructions  (From admission, onward)         Start     Ordered   08/25/18 0000  Discharge wound care:    Comments:  Take dressing off on day 5-7 postpartum.  Report increased drainage, redness or warmth. Clean with water, let soap trickle down body. Can leave steri strips on until they fall off or take them off gently at day 10. Keep open to air, clean and dry.   08/25/18 1034          Discharge Follow Up:  Follow-up [facility] Follow up.   Specialty:  Obstetrics and Gynecology Why:  Please make an insicion check for 4 week PP and 6 weeks PPV.  Contact information: [address] [place] [id number] Advance, NP-C, [place] 08/25/2018, 10:35 AM  Noralyn Pick, FNP

## 2018-08-25 NOTE — Lactation Note (Signed)
This note was copied from a baby's chart. Lactation Consultation Note  Patient Name: Ebony Valentine IRSWN'I Date: 08/25/2018 Reason for consult: Follow-up assessment;1st time breastfeeding;NICU baby;Term;Primapara  Visited with P1 Mom of term baby in NICU.  Baby may room-in with Mom tonight in NICU.   Mom has breastfed baby once in NICU for 5 mins.   Mom encouraged to keep baby STS as much as possible, and continue regular pumping.  Obtaining small volumes. Mom has a Spectra DEBP at home.   Interested in OP lactation follow-up.  Request made to clinic.    Plan- 1- Keep baby STS as much as possible 2- Offer breast when baby cues to eat. 3- Continue to double pump after baby breast feeds 4- offer EBM back to baby 5- Follow-up with OP lactation, request made to clinic. 6- Call for concerns prn.  Consult Status Consult Status: Follow-up Date: 08/28/18 Follow-up type: [place], Ada Holness E 08/25/2018, 10:28 AM

## 2018-08-26 ENCOUNTER — Ambulatory Visit: Payer: Self-pay

## 2018-08-26 NOTE — Lactation Note (Signed)
This note was copied from a baby's chart. Lactation Consultation Note Baby 40 hrs old time of consult. NICU baby w/parents rooming-in. Hopefully to be d/c home 08/26/18. Mom has large pendulous breast w/semi flat nipples. Very compressible, nipples everts w/stimulation, quickly soften w/o stimulation. Hand easily expressed colostrum. Mom sitting in rocking chair, baby in football hold. Mom using "C" hold. Encouraged to stimulate nipple prior to latching. Discussed sand witch hold for latching. Baby BF well. Discussed breast massage during feeding. Answered a lot of parents questions. Discussed milk coming in, breast filling, engorgement, milk storage, when to supplement. Encouraged mom w/MD permission to stop supplementing when mom's milk comes in. Mom wanted to know how to know if baby was getting enough. Discussed importance of cont. W/I&O. Baby has tight frenulum and labial frenulum. Baby has good extension and mobility of tongue. Cups tongue under finger and nipple well. Encouraged to occasionally massage breast during feeding. Suggested mom to speak w/MD regarding tongue tie. Mom's nipple intact. Has coconut oil. Denies painful latches or soreness. Discussed and demonstrated chin tug and lip flange. Heard swallows faintly. Baby satisfied after feeding. Mom has only pumped 2 times today. Encouraged to obtain a supplemental milk supply needs to supplement q 3 hrs or at least 5-6 times a day. Mom has her personal DEBP. Mom pump approx 3 colostrum. encouraged to give before formula. Shells given to assist in everting nipples more w/supportive bra. Mom has hand pump, encouraged to pre-pump prior to latching or stimulate nipples to evert prior to latching. Encouraged mom to make f/u appt. W/OP LC. Parents attentive gave verbal teach back. States has good understanding of latching and breast care.  Mom wishes to see [place] before d/c home.  Patient Name: Ebony Valentine KDTOI'Z Date:  08/26/2018 Reason for consult: Follow-up assessment;Mother's request;NICU baby;1st time breastfeeding   Maternal Data    Feeding Feeding Type: Breast Milk with Formula added Nipple Type: Regular  LATCH Score Latch: Grasps breast easily, tongue down, lips flanged, rhythmical sucking.  Audible Swallowing: Spontaneous and intermittent  Type of Nipple: Flat(semi flat)  Comfort (Breast/Nipple): Soft / non-tender  Hold (Positioning): Assistance needed to correctly position infant at breast and maintain latch.  LATCH Score: 8  Interventions Interventions: Breast feeding basics reviewed;Support pillows;Assisted with latch;Position options;Skin to skin;Breast massage;Hand express;Shells;Pre-pump if needed;Hand pump;Breast compression;DEBP;Adjust position  Lactation Tools Discussed/Used Tools: Shells;Pump Shell Type: Inverted Breast pump type: Double-Electric Breast Pump;Manual   Consult Status Consult Status: Follow-up Date: 08/26/18 Follow-up type: In-patient    Theodoro Kalata 08/26/2018, 12:59 AM

## 2018-08-26 NOTE — Lactation Note (Signed)
This note was copied from a baby's chart. Lactation Consultation Note; Follow up visit with this mom and NICU baby for DC today. Mom reports she has latched baby for 10 min before I came in and was bottle feeding formula as I went in  Offered assist with latch and mom agreeable. Latched to left breast in football hold. Took a few attempts. Reminded mom to support breast throughout the feeding and keep him close to the breast, Mom reports no pain with latch. Nursed for 10 min then getting fussy. Continued bottle feeding formula. Reviewed our phone number, OP appointments and BFSG as resources for support after DC. To call prn Encouragement given.  Patient Name: Ebony Valentine PYKDX'I Date: 08/26/2018 Reason for consult: Follow-up assessment   Maternal Data    Feeding Feeding Type: Breast Fed  LATCH Score Latch: Repeated attempts needed to sustain latch, nipple held in mouth throughout feeding, stimulation needed to elicit sucking reflex.  Audible Swallowing: A few with stimulation  Type of Nipple: Everted at rest and after stimulation  Comfort (Breast/Nipple): Soft / non-tender  Hold (Positioning): Assistance needed to correctly position infant at breast and maintain latch.  LATCH Score: 7  Interventions Interventions: Breast feeding basics reviewed;Assisted with latch;Position options;Support pillows  Lactation Tools Discussed/Used Breast pump type: Double-Electric Breast Pump   Consult Status Consult Status: Complete    [name] 08/26/2018, 9:52 AM

## 2018-09-19 ENCOUNTER — Telehealth: Payer: Self-pay | Admitting: Lactation Services

## 2018-09-19 NOTE — Telephone Encounter (Signed)
Pt states she delivered her baby 1 mth ago at [facility] in [place] and her doula informed her that she could take breastfeeding classes at [facility].  I informed her that we had a breastfeeding support group that meets twice a month and gave her the dates and times she could attend.  I also informed her about lactation consults and that she would need to pediatrician order to have this.  She is breastfeeding her baby and pumping her breasts after each feeding and obtains 1 oz of EBM with each pumping session, she gives this to baby after nursing and also gives baby 2 oz of formula after breastfeeding, baby is gaining weight well and output is adequate.  Her baby was in NICU at [facility] for a few days and she pumped her breasts during this time, she states baby latches well to breast but only nurses well for 5 min and then gets sleepy and nurses with stimulation for 5  More minutes on each breast.  Pt informed that we would be happy to see her for consult if she decides she would like to do this.

## 2020-07-08 ENCOUNTER — Other Ambulatory Visit: Payer: BC Managed Care – PPO

## 2020-07-08 ENCOUNTER — Other Ambulatory Visit: Payer: Self-pay | Admitting: Critical Care Medicine

## 2020-07-08 DIAGNOSIS — Z20822 Contact with and (suspected) exposure to covid-19: Secondary | ICD-10-CM

## 2020-07-10 LAB — NOVEL CORONAVIRUS, NAA: SARS-CoV-2, NAA: NOT DETECTED

## 2020-07-10 LAB — SARS-COV-2, NAA 2 DAY TAT

## 2021-05-08 LAB — OB RESULTS CONSOLE GC/CHLAMYDIA
Chlamydia: NEGATIVE
Gonorrhea: NEGATIVE

## 2021-05-08 LAB — OB RESULTS CONSOLE RUBELLA ANTIBODY, IGM: Rubella: IMMUNE

## 2021-05-08 LAB — HEPATITIS C ANTIBODY: HCV Ab: NEGATIVE

## 2021-05-08 LAB — OB RESULTS CONSOLE ANTIBODY SCREEN: Antibody Screen: NEGATIVE

## 2021-05-08 LAB — OB RESULTS CONSOLE ABO/RH: RH Type: POSITIVE

## 2021-05-08 LAB — OB RESULTS CONSOLE HIV ANTIBODY (ROUTINE TESTING): HIV: NONREACTIVE

## 2021-05-08 LAB — OB RESULTS CONSOLE RPR: RPR: NONREACTIVE

## 2021-05-08 LAB — OB RESULTS CONSOLE HEPATITIS B SURFACE ANTIGEN: Hepatitis B Surface Ag: NEGATIVE

## 2021-05-19 ENCOUNTER — Other Ambulatory Visit: Payer: Self-pay | Admitting: Obstetrics and Gynecology

## 2021-05-19 DIAGNOSIS — E049 Nontoxic goiter, unspecified: Secondary | ICD-10-CM

## 2021-06-16 ENCOUNTER — Ambulatory Visit
Admission: RE | Admit: 2021-06-16 | Discharge: 2021-06-16 | Disposition: A | Discharge status: Home / Self Care | Payer: 59 | Source: Ambulatory Visit | Attending: Obstetrics and Gynecology | Admitting: Obstetrics and Gynecology

## 2021-06-16 DIAGNOSIS — E049 Nontoxic goiter, unspecified: Secondary | ICD-10-CM

## 2021-10-23 ENCOUNTER — Other Ambulatory Visit: Payer: Self-pay | Admitting: Obstetrics and Gynecology

## 2021-10-23 DIAGNOSIS — Z8759 Personal history of other complications of pregnancy, childbirth and the puerperium: Secondary | ICD-10-CM

## 2021-10-27 ENCOUNTER — Encounter (HOSPITAL_COMMUNITY): Payer: Self-pay | Admitting: *Deleted

## 2021-10-27 NOTE — Patient Instructions (Signed)
Ebony Valentine  10/27/2021   Your procedure is scheduled on:  11/06/2021  Arrive at [place] at Entrance C on [address] at [facility]  and [address]. You are invited to use the FREE valet parking or use the Visitor's parking deck.  Pick up the phone at the desk and dial [phone].  Call this number if you have problems the morning of surgery: [phone]  Remember:   Do not eat food:(After Midnight) Desps de medianoche.  Do not drink clear liquids: (After Midnight) Desps de medianoche.  Take these medicines the morning of surgery with A SIP OF WATER:  none   Do not wear jewelry, make-up or nail polish.  Do not wear lotions, powders, or perfumes. Do not wear deodorant.  Do not shave 48 hours prior to surgery.  Do not bring valuables to the hospital.  [facility] is not   responsible for any belongings or valuables brought to the hospital.  Contacts, dentures or bridgework may not be worn into surgery.  Leave suitcase in the car. After surgery it may be brought to your room.  For patients admitted to the hospital, checkout time is 11:00 AM the day of              discharge.      Please read over the following fact sheets that you were given:     Preparing for Surgery

## 2021-10-30 ENCOUNTER — Encounter (HOSPITAL_COMMUNITY): Payer: Self-pay

## 2021-11-02 ENCOUNTER — Ambulatory Visit: Payer: 59 | Attending: Obstetrics and Gynecology

## 2021-11-02 ENCOUNTER — Other Ambulatory Visit: Payer: Self-pay | Admitting: Obstetrics and Gynecology

## 2021-11-02 ENCOUNTER — Other Ambulatory Visit: Payer: Self-pay

## 2021-11-02 ENCOUNTER — Ambulatory Visit: Payer: 59 | Admitting: *Deleted

## 2021-11-02 VITALS — BP 117/65 | HR 113

## 2021-11-02 DIAGNOSIS — Z3A36 36 weeks gestation of pregnancy: Secondary | ICD-10-CM

## 2021-11-02 DIAGNOSIS — O34219 Maternal care for unspecified type scar from previous cesarean delivery: Secondary | ICD-10-CM | POA: Diagnosis present

## 2021-11-02 DIAGNOSIS — Z8759 Personal history of other complications of pregnancy, childbirth and the puerperium: Secondary | ICD-10-CM | POA: Diagnosis not present

## 2021-11-02 DIAGNOSIS — O09523 Supervision of elderly multigravida, third trimester: Secondary | ICD-10-CM | POA: Diagnosis not present

## 2021-11-02 DIAGNOSIS — O09293 Supervision of pregnancy with other poor reproductive or obstetric history, third trimester: Secondary | ICD-10-CM

## 2021-11-02 DIAGNOSIS — O4403 Placenta previa specified as without hemorrhage, third trimester: Secondary | ICD-10-CM | POA: Diagnosis not present

## 2021-11-03 ENCOUNTER — Other Ambulatory Visit: Payer: Self-pay | Admitting: Obstetrics and Gynecology

## 2021-11-03 ENCOUNTER — Other Ambulatory Visit (HOSPITAL_COMMUNITY)
Admission: RE | Admit: 2021-11-03 | Discharge: 2021-11-03 | Disposition: A | Discharge status: Home / Self Care | Payer: 59 | Source: Ambulatory Visit | Attending: Obstetrics and Gynecology | Admitting: Obstetrics and Gynecology

## 2021-11-03 DIAGNOSIS — O4403 Placenta previa specified as without hemorrhage, third trimester: Secondary | ICD-10-CM

## 2021-11-03 MED ORDER — BETAMETHASONE SOD PHOS & ACET 6 (3-3) MG/ML IJ SUSP: 12.0000 mg | Freq: Once | INTRAMUSCULAR | Status: DC

## 2021-11-08 NOTE — Patient Instructions (Signed)
Addaleigh Nicholls  11/08/2021   Your procedure is scheduled on:  11/11/2021  Arrive at [place] at Entrance C on [address] at [facility]  and [address]. You are invited to use the FREE valet parking or use the Visitor's parking deck.  Pick up the phone at the desk and dial [phone].  Call this number if you have problems the morning of surgery: [phone]  Remember:   Do not eat food:(After Midnight) Desps de medianoche.  Do not drink clear liquids: (After Midnight) Desps de medianoche.  Take these medicines the morning of surgery with A SIP OF WATER:  none   Do not wear jewelry, make-up or nail polish.  Do not wear lotions, powders, or perfumes. Do not wear deodorant.  Do not shave 48 hours prior to surgery.  Do not bring valuables to the hospital.  [facility] is not   responsible for any belongings or valuables brought to the hospital.  Contacts, dentures or bridgework may not be worn into surgery.  Leave suitcase in the car. After surgery it may be brought to your room.  For patients admitted to the hospital, checkout time is 11:00 AM the day of              discharge.      Please read over the following fact sheets that you were given:     Preparing for Surgery

## 2021-11-09 ENCOUNTER — Other Ambulatory Visit: Payer: Self-pay

## 2021-11-09 ENCOUNTER — Other Ambulatory Visit (HOSPITAL_COMMUNITY)
Admission: RE | Admit: 2021-11-09 | Discharge: 2021-11-09 | Disposition: A | Discharge status: Home / Self Care | Payer: 59 | Source: Ambulatory Visit | Attending: Obstetrics and Gynecology | Admitting: Obstetrics and Gynecology

## 2021-11-09 ENCOUNTER — Other Ambulatory Visit: Payer: Self-pay | Admitting: Obstetrics and Gynecology

## 2021-11-09 DIAGNOSIS — O4403 Placenta previa specified as without hemorrhage, third trimester: Secondary | ICD-10-CM

## 2021-11-09 DIAGNOSIS — Z3A00 Weeks of gestation of pregnancy not specified: Secondary | ICD-10-CM | POA: Insufficient documentation

## 2021-11-09 DIAGNOSIS — O09523 Supervision of elderly multigravida, third trimester: Secondary | ICD-10-CM | POA: Insufficient documentation

## 2021-11-09 LAB — CBC
HCT: 36.4 % (ref 36.0–46.0)
Hemoglobin: 12.5 g/dL (ref 12.0–15.0)
MCH: 32.8 pg (ref 26.0–34.0)
MCHC: 34.3 g/dL (ref 30.0–36.0)
MCV: 95.5 fL (ref 80.0–100.0)
Platelets: 255 10*3/uL (ref 150–400)
RBC: 3.81 MIL/uL — ABNORMAL LOW (ref 3.87–5.11)
RDW: 14.3 % (ref 11.5–15.5)
WBC: 9.3 10*3/uL (ref 4.0–10.5)
nRBC: 0 % (ref 0.0–0.2)

## 2021-11-09 LAB — TYPE AND SCREEN
ABO/RH(D): O POS
Antibody Screen: NEGATIVE

## 2021-11-09 LAB — SARS CORONAVIRUS 2 (TAT 6-24 HRS): SARS Coronavirus 2: POSITIVE — AB

## 2021-11-09 NOTE — Progress Notes (Signed)
Received a call from [facility], pt is positive for Covid. Garnette Czech at L&D notified.

## 2021-11-10 ENCOUNTER — Encounter (HOSPITAL_COMMUNITY): Payer: Self-pay | Admitting: Obstetrics and Gynecology

## 2021-11-10 LAB — RPR: RPR Ser Ql: NONREACTIVE

## 2021-11-10 NOTE — Anesthesia Preprocedure Evaluation (Addendum)
Anesthesia Evaluation  Patient identified by MRN, date of birth, ID band Patient awake    Reviewed: Allergy & Precautions, NPO status , Patient's Chart, lab work & pertinent test results  Airway Mallampati: I       Dental no notable dental hx.    Pulmonary neg pulmonary ROS,    breath sounds clear to auscultation       Cardiovascular negative cardio ROS   Rhythm:Regular     Neuro/Psych negative neurological ROS  negative psych ROS   GI/Hepatic Neg liver ROS,   Endo/Other  negative endocrine ROS  Renal/GU negative Renal ROS     Musculoskeletal   Abdominal Normal abdominal exam  (+)   Peds  Hematology   Anesthesia Other Findings   Reproductive/Obstetrics (+) Pregnancy                            Anesthesia Physical Anesthesia Plan  ASA: 2  Anesthesia Plan: Spinal   Post-op Pain Management:    Induction: Intravenous  PONV Risk Score and Plan: 1 and Ondansetron  Airway Management Planned: Natural Airway and Nasal Cannula  Additional Equipment: None  Intra-op Plan:   Post-operative Plan:   Informed Consent: I have reviewed the patients History and Physical, chart, labs and discussed the procedure including the risks, benefits and alternatives for the proposed anesthesia with the patient or authorized representative who has indicated his/her understanding and acceptance.       Plan Discussed with: CRNA  Anesthesia Plan Comments: (Lab Results      Component                Value               Date                      WBC                      9.3                 11/09/2021                HGB                      12.5                11/09/2021                HCT                      36.4                11/09/2021                MCV                      95.5                11/09/2021                PLT                      255                 11/09/2021           )  Anesthesia Quick Evaluation

## 2021-11-11 ENCOUNTER — Inpatient Hospital Stay (HOSPITAL_COMMUNITY): Payer: 59 | Admitting: Anesthesiology

## 2021-11-11 ENCOUNTER — Encounter (HOSPITAL_COMMUNITY)
Admission: RE | Disposition: A | Discharge status: Home / Self Care | Payer: Self-pay | Source: Ambulatory Visit | Attending: Obstetrics and Gynecology

## 2021-11-11 ENCOUNTER — Encounter (HOSPITAL_COMMUNITY): Payer: Self-pay | Admitting: Obstetrics and Gynecology

## 2021-11-11 ENCOUNTER — Inpatient Hospital Stay (HOSPITAL_COMMUNITY)
Admission: RE | Admit: 2021-11-11 | Discharge: 2021-11-13 | DRG: 786 | Disposition: A | Discharge status: Home / Self Care | Payer: 59 | Source: Ambulatory Visit | Attending: Obstetrics and Gynecology | Admitting: Obstetrics and Gynecology

## 2021-11-11 ENCOUNTER — Other Ambulatory Visit: Payer: Self-pay

## 2021-11-11 DIAGNOSIS — O99824 Streptococcus B carrier state complicating childbirth: Secondary | ICD-10-CM | POA: Diagnosis present

## 2021-11-11 DIAGNOSIS — O4403 Placenta previa specified as without hemorrhage, third trimester: Principal | ICD-10-CM | POA: Diagnosis present

## 2021-11-11 DIAGNOSIS — Z23 Encounter for immunization: Secondary | ICD-10-CM

## 2021-11-11 DIAGNOSIS — O9852 Other viral diseases complicating childbirth: Secondary | ICD-10-CM | POA: Diagnosis present

## 2021-11-11 DIAGNOSIS — O322XX0 Maternal care for transverse and oblique lie, not applicable or unspecified: Secondary | ICD-10-CM | POA: Diagnosis present

## 2021-11-11 DIAGNOSIS — U071 COVID-19: Secondary | ICD-10-CM | POA: Diagnosis present

## 2021-11-11 DIAGNOSIS — O34219 Maternal care for unspecified type scar from previous cesarean delivery: Secondary | ICD-10-CM | POA: Diagnosis present

## 2021-11-11 DIAGNOSIS — O9902 Anemia complicating childbirth: Secondary | ICD-10-CM | POA: Diagnosis present

## 2021-11-11 DIAGNOSIS — Z3A37 37 weeks gestation of pregnancy: Secondary | ICD-10-CM

## 2021-11-11 DIAGNOSIS — D509 Iron deficiency anemia, unspecified: Secondary | ICD-10-CM | POA: Diagnosis present

## 2021-11-11 LAB — TYPE AND SCREEN
ABO/RH(D): O POS
Antibody Screen: NEGATIVE

## 2021-11-11 LAB — CBC
HCT: 28.9 % — ABNORMAL LOW (ref 36.0–46.0)
Hemoglobin: 9.9 g/dL — ABNORMAL LOW (ref 12.0–15.0)
MCH: 32.8 pg (ref 26.0–34.0)
MCHC: 34.3 g/dL (ref 30.0–36.0)
MCV: 95.7 fL (ref 80.0–100.0)
Platelets: 193 10*3/uL (ref 150–400)
RBC: 3.02 MIL/uL — ABNORMAL LOW (ref 3.87–5.11)
RDW: 14.1 % (ref 11.5–15.5)
WBC: 15.7 10*3/uL — ABNORMAL HIGH (ref 4.0–10.5)
nRBC: 0 % (ref 0.0–0.2)

## 2021-11-11 SURGERY — Surgical Case
Anesthesia: Spinal

## 2021-11-11 MED ORDER — BUPIVACAINE HCL (PF) 0.25 % IJ SOLN
INTRAMUSCULAR | Status: DC | PRN
  Administered 2021-11-11: 10 mL

## 2021-11-11 MED ORDER — ACETAMINOPHEN 160 MG/5ML PO SOLN: 325.0000 mg | ORAL | Status: DC | PRN

## 2021-11-11 MED ORDER — LACTATED RINGERS IV BOLUS
1000.0000 mL | Freq: Once | INTRAVENOUS | Status: AC
  Administered 2021-11-11: 1000 mL via INTRAVENOUS

## 2021-11-11 MED ORDER — PHENYLEPHRINE HCL-NACL 20-0.9 MG/250ML-% IV SOLN
INTRAVENOUS | Status: DC | PRN
  Administered 2021-11-11: 60 ug/min via INTRAVENOUS

## 2021-11-11 MED ORDER — LACTATED RINGERS IV SOLN: INTRAVENOUS | Status: DC | PRN

## 2021-11-11 MED ORDER — ACETAMINOPHEN 500 MG PO TABS
1000.0000 mg | ORAL_TABLET | Freq: Four times a day (QID) | ORAL | Status: DC
  Administered 2021-11-11 – 2021-11-13 (×7): 1000 mg via ORAL
  Filled 2021-11-11 (×8): qty 2

## 2021-11-11 MED ORDER — ONDANSETRON HCL 4 MG/2ML IJ SOLN
INTRAMUSCULAR | Status: AC
  Filled 2021-11-11: qty 2

## 2021-11-11 MED ORDER — SENNOSIDES-DOCUSATE SODIUM 8.6-50 MG PO TABS
2.0000 | ORAL_TABLET | ORAL | Status: DC
  Administered 2021-11-12: 2 via ORAL
  Filled 2021-11-11 (×2): qty 2

## 2021-11-11 MED ORDER — SIMETHICONE 80 MG PO CHEW
80.0000 mg | CHEWABLE_TABLET | Freq: Three times a day (TID) | ORAL | Status: DC
  Administered 2021-11-11 – 2021-11-13 (×5): 80 mg via ORAL
  Filled 2021-11-11 (×5): qty 1

## 2021-11-11 MED ORDER — SOD CITRATE-CITRIC ACID 500-334 MG/5ML PO SOLN
ORAL | Status: AC
  Filled 2021-11-11: qty 30

## 2021-11-11 MED ORDER — NALOXONE HCL 4 MG/10ML IJ SOLN
1.0000 ug/kg/h | INTRAVENOUS | Status: DC | PRN
  Filled 2021-11-11: qty 5

## 2021-11-11 MED ORDER — SCOPOLAMINE 1 MG/3DAYS TD PT72
1.0000 | MEDICATED_PATCH | Freq: Once | TRANSDERMAL | Status: AC
  Administered 2021-11-11 (×2): 1.5 mg via TRANSDERMAL
  Filled 2021-11-11 (×2): qty 1

## 2021-11-11 MED ORDER — ACETAMINOPHEN 10 MG/ML IV SOLN
INTRAVENOUS | Status: AC
  Filled 2021-11-11: qty 100

## 2021-11-11 MED ORDER — FENTANYL CITRATE (PF) 100 MCG/2ML IJ SOLN
INTRAMUSCULAR | Status: DC | PRN
  Administered 2021-11-11: 15 ug via INTRATHECAL

## 2021-11-11 MED ORDER — FENTANYL CITRATE (PF) 100 MCG/2ML IJ SOLN
INTRAMUSCULAR | Status: AC
  Filled 2021-11-11: qty 2

## 2021-11-11 MED ORDER — NALBUPHINE HCL 10 MG/ML IJ SOLN: 5.0000 mg | INTRAMUSCULAR | Status: DC | PRN

## 2021-11-11 MED ORDER — TRANEXAMIC ACID-NACL 1000-0.7 MG/100ML-% IV SOLN
INTRAVENOUS | Status: AC
  Filled 2021-11-11: qty 100

## 2021-11-11 MED ORDER — PHENYLEPHRINE HCL-NACL 20-0.9 MG/250ML-% IV SOLN
INTRAVENOUS | Status: AC
  Filled 2021-11-11: qty 250

## 2021-11-11 MED ORDER — ONDANSETRON HCL 4 MG/2ML IJ SOLN: 4.0000 mg | Freq: Three times a day (TID) | INTRAMUSCULAR | Status: DC | PRN

## 2021-11-11 MED ORDER — SCOPOLAMINE 1 MG/3DAYS TD PT72: 1.0000 | MEDICATED_PATCH | Freq: Once | TRANSDERMAL | Status: DC

## 2021-11-11 MED ORDER — ACETAMINOPHEN 325 MG PO TABS: 325.0000 mg | ORAL_TABLET | ORAL | Status: DC | PRN

## 2021-11-11 MED ORDER — OXYTOCIN-SODIUM CHLORIDE 30-0.9 UT/500ML-% IV SOLN
INTRAVENOUS | Status: AC
  Filled 2021-11-11: qty 500

## 2021-11-11 MED ORDER — CEFAZOLIN SODIUM-DEXTROSE 2-4 GM/100ML-% IV SOLN
2.0000 g | INTRAVENOUS | Status: AC
  Administered 2021-11-11: 2 g via INTRAVENOUS

## 2021-11-11 MED ORDER — METOCLOPRAMIDE HCL 5 MG/ML IJ SOLN
INTRAMUSCULAR | Status: AC
  Filled 2021-11-11: qty 2

## 2021-11-11 MED ORDER — MENTHOL 3 MG MT LOZG: 1.0000 | LOZENGE | OROMUCOSAL | Status: DC | PRN

## 2021-11-11 MED ORDER — OXYCODONE HCL 5 MG PO TABS
5.0000 mg | ORAL_TABLET | ORAL | Status: DC | PRN
  Administered 2021-11-12: 5 mg via ORAL
  Administered 2021-11-12 – 2021-11-13 (×4): 10 mg via ORAL
  Filled 2021-11-11 (×2): qty 2
  Filled 2021-11-11: qty 1
  Filled 2021-11-11 (×2): qty 2

## 2021-11-11 MED ORDER — ACETAMINOPHEN 10 MG/ML IV SOLN
INTRAVENOUS | Status: DC | PRN
  Administered 2021-11-11: 1000 mg via INTRAVENOUS

## 2021-11-11 MED ORDER — LACTATED RINGERS IV SOLN: INTRAVENOUS | Status: DC

## 2021-11-11 MED ORDER — KETOROLAC TROMETHAMINE 30 MG/ML IJ SOLN
INTRAMUSCULAR | Status: AC
  Filled 2021-11-11: qty 1

## 2021-11-11 MED ORDER — ZOLPIDEM TARTRATE 5 MG PO TABS: 5.0000 mg | ORAL_TABLET | Freq: Every evening | ORAL | Status: DC | PRN

## 2021-11-11 MED ORDER — FENTANYL CITRATE (PF) 100 MCG/2ML IJ SOLN: 25.0000 ug | INTRAMUSCULAR | Status: DC | PRN

## 2021-11-11 MED ORDER — NALBUPHINE HCL 10 MG/ML IJ SOLN
5.0000 mg | INTRAMUSCULAR | Status: DC | PRN
  Administered 2021-11-12: 5 mg via INTRAVENOUS
  Filled 2021-11-11: qty 1

## 2021-11-11 MED ORDER — BUPIVACAINE HCL (PF) 0.25 % IJ SOLN
INTRAMUSCULAR | Status: AC
  Filled 2021-11-11: qty 30

## 2021-11-11 MED ORDER — DEXAMETHASONE SODIUM PHOSPHATE 10 MG/ML IJ SOLN
INTRAMUSCULAR | Status: DC | PRN
  Administered 2021-11-11: 10 mg via INTRAVENOUS

## 2021-11-11 MED ORDER — DIPHENHYDRAMINE HCL 50 MG/ML IJ SOLN
INTRAMUSCULAR | Status: AC
  Filled 2021-11-11: qty 1

## 2021-11-11 MED ORDER — PRENATAL MULTIVITAMIN CH
1.0000 | ORAL_TABLET | Freq: Every day | ORAL | Status: DC
  Administered 2021-11-12 – 2021-11-13 (×2): 1 via ORAL
  Filled 2021-11-11 (×3): qty 1

## 2021-11-11 MED ORDER — DIPHENHYDRAMINE HCL 25 MG PO CAPS: 25.0000 mg | ORAL_CAPSULE | Freq: Four times a day (QID) | ORAL | Status: DC | PRN

## 2021-11-11 MED ORDER — ACETAMINOPHEN 10 MG/ML IV SOLN: 1000.0000 mg | Freq: Once | INTRAVENOUS | Status: DC | PRN

## 2021-11-11 MED ORDER — SODIUM CHLORIDE 0.9% FLUSH: 3.0000 mL | INTRAVENOUS | Status: DC | PRN

## 2021-11-11 MED ORDER — ALBUMIN HUMAN 5 % IV SOLN: INTRAVENOUS | Status: DC | PRN

## 2021-11-11 MED ORDER — MORPHINE SULFATE (PF) 0.5 MG/ML IJ SOLN
INTRAMUSCULAR | Status: AC
  Filled 2021-11-11: qty 10

## 2021-11-11 MED ORDER — OXYTOCIN-SODIUM CHLORIDE 30-0.9 UT/500ML-% IV SOLN
INTRAVENOUS | Status: DC | PRN
  Administered 2021-11-11: 30 [IU] via INTRAVENOUS

## 2021-11-11 MED ORDER — KETOROLAC TROMETHAMINE 30 MG/ML IJ SOLN: 30.0000 mg | Freq: Four times a day (QID) | INTRAMUSCULAR | Status: DC | PRN

## 2021-11-11 MED ORDER — TRANEXAMIC ACID-NACL 1000-0.7 MG/100ML-% IV SOLN
INTRAVENOUS | Status: DC | PRN
  Administered 2021-11-11: 1000 mg via INTRAVENOUS

## 2021-11-11 MED ORDER — DIPHENHYDRAMINE HCL 50 MG/ML IJ SOLN: 12.5000 mg | INTRAMUSCULAR | Status: DC | PRN

## 2021-11-11 MED ORDER — BUPIVACAINE IN DEXTROSE 0.75-8.25 % IT SOLN
INTRATHECAL | Status: DC | PRN
  Administered 2021-11-11: 1.6 mL via INTRATHECAL

## 2021-11-11 MED ORDER — KETOROLAC TROMETHAMINE 30 MG/ML IJ SOLN: 30.0000 mg | Freq: Four times a day (QID) | INTRAMUSCULAR | Status: AC | PRN

## 2021-11-11 MED ORDER — NALOXONE HCL 0.4 MG/ML IJ SOLN: 0.4000 mg | INTRAMUSCULAR | Status: DC | PRN

## 2021-11-11 MED ORDER — DIPHENHYDRAMINE HCL 50 MG/ML IJ SOLN
INTRAMUSCULAR | Status: DC | PRN
  Administered 2021-11-11 (×2): 12.5 mg via INTRAVENOUS

## 2021-11-11 MED ORDER — WITCH HAZEL-GLYCERIN EX PADS: 1.0000 "application " | MEDICATED_PAD | CUTANEOUS | Status: DC | PRN

## 2021-11-11 MED ORDER — DIPHENHYDRAMINE HCL 25 MG PO CAPS: 25.0000 mg | ORAL_CAPSULE | ORAL | Status: DC | PRN

## 2021-11-11 MED ORDER — ALBUMIN HUMAN 5 % IV SOLN
INTRAVENOUS | Status: AC
  Filled 2021-11-11: qty 250

## 2021-11-11 MED ORDER — DEXAMETHASONE SODIUM PHOSPHATE 10 MG/ML IJ SOLN
INTRAMUSCULAR | Status: AC
  Filled 2021-11-11: qty 1

## 2021-11-11 MED ORDER — POVIDONE-IODINE 10 % EX SWAB
2.0000 "application " | Freq: Once | CUTANEOUS | Status: AC
  Administered 2021-11-11: 2 via TOPICAL

## 2021-11-11 MED ORDER — CEFAZOLIN SODIUM-DEXTROSE 2-4 GM/100ML-% IV SOLN
INTRAVENOUS | Status: AC
  Filled 2021-11-11: qty 100

## 2021-11-11 MED ORDER — EPHEDRINE SULFATE 50 MG/ML IJ SOLN
INTRAMUSCULAR | Status: DC | PRN
  Administered 2021-11-11 (×3): 10 mg via INTRAVENOUS

## 2021-11-11 MED ORDER — SIMETHICONE 80 MG PO CHEW: 80.0000 mg | CHEWABLE_TABLET | ORAL | Status: DC | PRN

## 2021-11-11 MED ORDER — NALOXONE HCL 4 MG/10ML IJ SOLN: 1.0000 ug/kg/h | INTRAVENOUS | Status: DC | PRN

## 2021-11-11 MED ORDER — PHENYLEPHRINE HCL (PRESSORS) 10 MG/ML IV SOLN
INTRAVENOUS | Status: DC | PRN
  Administered 2021-11-11 (×4): 40 ug via INTRAVENOUS

## 2021-11-11 MED ORDER — MORPHINE SULFATE (PF) 0.5 MG/ML IJ SOLN
INTRAMUSCULAR | Status: DC | PRN
  Administered 2021-11-11: .15 mg via INTRATHECAL

## 2021-11-11 MED ORDER — PROMETHAZINE HCL 25 MG/ML IJ SOLN: 6.2500 mg | INTRAMUSCULAR | Status: DC | PRN

## 2021-11-11 MED ORDER — COCONUT OIL OIL
1.0000 "application " | TOPICAL_OIL | Status: DC | PRN
  Administered 2021-11-12: 1 via TOPICAL

## 2021-11-11 MED ORDER — ONDANSETRON HCL 4 MG/2ML IJ SOLN
INTRAMUSCULAR | Status: DC | PRN
  Administered 2021-11-11: 4 mg via INTRAVENOUS

## 2021-11-11 MED ORDER — DIBUCAINE (PERIANAL) 1 % EX OINT: 1.0000 "application " | TOPICAL_OINTMENT | CUTANEOUS | Status: DC | PRN

## 2021-11-11 MED ORDER — OXYTOCIN-SODIUM CHLORIDE 30-0.9 UT/500ML-% IV SOLN
2.5000 [IU]/h | INTRAVENOUS | Status: AC
  Administered 2021-11-11: 2.5 [IU]/h via INTRAVENOUS
  Filled 2021-11-11: qty 500

## 2021-11-11 MED ORDER — SOD CITRATE-CITRIC ACID 500-334 MG/5ML PO SOLN
30.0000 mL | ORAL | Status: AC
  Administered 2021-11-11: 30 mL via ORAL

## 2021-11-11 MED ORDER — METOCLOPRAMIDE HCL 5 MG/ML IJ SOLN
INTRAMUSCULAR | Status: DC | PRN
  Administered 2021-11-11: 10 mg via INTRAVENOUS

## 2021-11-11 SURGICAL SUPPLY — 40 items
BARRIER ADHS 3X4 INTERCEED (GAUZE/BANDAGES/DRESSINGS) ×2 IMPLANT
BENZOIN TINCTURE PRP APPL 2/3 (GAUZE/BANDAGES/DRESSINGS) ×1 IMPLANT
CHLORAPREP W/TINT 26ML (MISCELLANEOUS) ×2 IMPLANT
CLAMP CORD UMBIL (MISCELLANEOUS) IMPLANT
CLOTH BEACON ORANGE TIMEOUT ST (SAFETY) ×2 IMPLANT
DRAPE C SECTION CLR SCREEN (DRAPES) ×2 IMPLANT
DRSG OPSITE POSTOP 4X10 (GAUZE/BANDAGES/DRESSINGS) ×2 IMPLANT
EXTRACTOR VACUUM M CUP 4 TUBE (SUCTIONS) IMPLANT
GLOVE BIOGEL PI IND STRL 7.0 (GLOVE) ×2 IMPLANT
GLOVE BIOGEL PI INDICATOR 7.0 (GLOVE) ×2
GLOVE ECLIPSE 6.5 STRL STRAW (GLOVE) ×2 IMPLANT
GOWN STRL REUS W/TWL LRG LVL3 (GOWN DISPOSABLE) ×4 IMPLANT
KIT ABG SYR 3ML LUER SLIP (SYRINGE) IMPLANT
NDL HYPO 25X5/8 SAFETYGLIDE (NEEDLE) IMPLANT
NEEDLE HYPO 22GX1.5 SAFETY (NEEDLE) ×2 IMPLANT
NEEDLE HYPO 25X5/8 SAFETYGLIDE (NEEDLE) IMPLANT
NS IRRIG 1000ML POUR BTL (IV SOLUTION) ×2 IMPLANT
PACK C SECTION WH (CUSTOM PROCEDURE TRAY) ×2 IMPLANT
PAD OB MATERNITY 4.3X12.25 (PERSONAL CARE ITEMS) ×2 IMPLANT
RTRCTR C-SECT PINK 25CM LRG (MISCELLANEOUS) IMPLANT
STRIP CLOSURE SKIN 1/2X4 (GAUZE/BANDAGES/DRESSINGS) ×1 IMPLANT
SUT CHROMIC GUT AB #0 18 (SUTURE) IMPLANT
SUT MNCRL 0 VIOLET CTX 36 (SUTURE) ×3 IMPLANT
SUT MON AB 2-0 SH 27 (SUTURE)
SUT MON AB 2-0 SH27 (SUTURE) IMPLANT
SUT MON AB 3-0 SH 27 (SUTURE)
SUT MON AB 3-0 SH27 (SUTURE) IMPLANT
SUT MON AB 4-0 PS1 27 (SUTURE) IMPLANT
SUT MONOCRYL 0 CTX 36 (SUTURE) ×3
SUT PLAIN 2 0 (SUTURE)
SUT PLAIN 2 0 XLH (SUTURE) IMPLANT
SUT PLAIN ABS 2-0 CT1 27XMFL (SUTURE) IMPLANT
SUT VIC AB 0 CT1 36 (SUTURE) ×4 IMPLANT
SUT VIC AB 2-0 CT1 27 (SUTURE) ×1
SUT VIC AB 2-0 CT1 TAPERPNT 27 (SUTURE) ×1 IMPLANT
SUT VIC AB 4-0 PS2 27 (SUTURE) IMPLANT
SYR CONTROL 10ML LL (SYRINGE) ×2 IMPLANT
TOWEL OR 17X24 6PK STRL BLUE (TOWEL DISPOSABLE) ×2 IMPLANT
TRAY FOLEY W/BAG SLVR 14FR LF (SET/KITS/TRAYS/PACK) IMPLANT
WATER STERILE IRR 1000ML POUR (IV SOLUTION) ×2 IMPLANT

## 2021-11-11 NOTE — Anesthesia Postprocedure Evaluation (Addendum)
Anesthesia Post Note  Patient: Ebony Valentine  Procedure(s) Performed: CESAREAN SECTION APPLICATION OF CELL SAVER     Patient location during evaluation: PACU Anesthesia Type: Combined Spinal/Epidural Level of consciousness: oriented and awake and alert Pain management: pain level controlled Vital Signs Assessment: post-procedure vital signs reviewed and stable Respiratory status: spontaneous breathing, respiratory function stable and patient connected to nasal cannula oxygen Cardiovascular status: blood pressure returned to baseline and stable Postop Assessment: no headache, no backache and no apparent nausea or vomiting Anesthetic complications: no   No notable events documented.  Last Vitals:  Vitals:   11/11/21 1145 11/11/21 1159  BP:  104/66  Pulse:  79  Resp: 17 18  Temp:  36.5 C  SpO2:  100%    Last Pain:  Vitals:   11/11/21 1159  TempSrc: Oral   Pain Goal:                   Effie Berkshire

## 2021-11-11 NOTE — Op Note (Signed)
Ebony Valentine, Ebony Valentine MEDICAL RECORD NO: [id number] ACCOUNT NO: [id number] DATE OF BIRTH: 14-Feb-1986 FACILITY: [facility] LOCATION: [facility] PHYSICIAN: Mell Mellott A. Garwin Brothers, MD  Operative Report   DATE OF PROCEDURE: 11/11/2021   PREOPERATIVE DIAGNOSIS:  Placenta previa, previous cesarean section, intrauterine gestation at 37 weeks, Covid 19 positive, Group streptococcus positive.  PROCEDURE:  Repeat cesarean section Kerr hysterotomy.  POSTOPERATIVE DIAGNOSIS:  Placenta previa, previous cesarean section, intrauterine gestation at 37 weeks, Covid 19 positive, group B streptococcus positive, postpartum hemorrhage.    ANESTHESIA: Combined spinal epidural.  SURGEON:  Averleigh Savary A. Garwin Brothers, MD  ASSISTANT:  None.  DESCRIPTION OF PROCEDURE:  Under adequate combined spinal epidural anesthesia the patient was placed in the supine position with a left lateral tilt.  An indwelling Foley catheter was placed sterilely and draining clear yellow urine.  0.25% Marcaine was  injected along the previous Pfannenstiel skin incision site.  Pfannenstiel skin incision was then made, carried down to the rectus fascia.  The rectus fascia was opened transversely.  The rectus fascia was then bluntly and sharply dissected off the  rectus muscle in the superior and inferior fashion.  The rectus muscle was partially separated and the parietal peritoneum was seen and entered bluntly and extended under direct visualization.  An Alexis retractor was then placed.  Lower uterine segment  was boggy with large vessels noted.  The fetal part was not in the pelvis.  A small curvilinear incision was made above the bladder reflection and carried to the level of the amniotic sac.  Amniotic sac remained intact.  The incision was extended  bluntly in the cephalic and caudad manner.  Through the intact amniotic sac, fetal part was attempted to be identified.  The patient's baby was in a transverse position.  Incidental rupture of membranes  occurred.  Subsequently, the head was found on the  maternal right and was directed down into the pelvis.  The loops of cord that was adjacent was pushed back into the uterine cavity and the baby was delivered as a live female who had a delayed cord clamp x 1 minute.  The baby was bulb suctioned on the  abdomen.  Cord was clamped and cut.  The baby was transferred to the waiting pediatricians, who assigned Apgars 9 and 9 at 1 and 5 minutes.  The placenta started to be partially separating, appeared to be a small portion in the anterior part and was able  to be removed intact.  Uterine cavity was cleaned of debris.  Cell Saver was utilized.  The uterine incision was closed in one layer using 0 Monocryl running locked stitch.  On the right, there was an angle suture bleeder that was suture ligated with 0  Vicryl as well.  It was difficult to see the tubes and ovaries bilaterally due to adhesions on both sides and of the ovaries and both palpation felt normal, but was clearly adherent to the uterus.  They were not touched.  Abdomen was irrigated and  suctioned of debris with good hemostasis noted.  A second layer was not placed.  Interceed was placed overlying the incision in an inverted T fashion.  The Alexis retractor was removed.  The parietal peritoneum was closed with 2-0 Vicryl.  The rectus  fascia was closed with 0 Vicryl x2.  The subcutaneous area was irrigated, small bleeders cauterized.  Interrupted 2-0 plain suture was placed and the skin approximated using 4-0 Monocryl suture.  Steri-Strips and benzoin was placed.  SPECIMEN:  Placenta, not sent  to Pathology.  Estimated blood loss 1257 ml  INTRAOPERATIVE FLUIDS:  1800  ml crystalloid, 250 mL of albumin and the patient received intravenous TXA as well.    URINE OUTPUT:  200 mL clear yellow urine.  Cell Saver used  Sponge and instrument counts x2 was correct.  Complication was none.  The patient tolerated the procedure well and was transferred to  recovery room in stable condition.      Elián.Darby D: 11/11/2021 10:28:53 am T: 11/11/2021 10:05:00 pm  JOB: [id number]/ [id number]

## 2021-11-11 NOTE — Brief Op Note (Signed)
11/11/2021  10:29 AM  PATIENT:  Ebony Valentine  35 y.o. female  PRE-OPERATIVE DIAGNOSIS:  Placenta previa, Previous Cesarean section, IUP @ 37 wk, COVID 19 Positive  POST-OPERATIVE DIAGNOSIS:  same  PROCEDURE:  Repeat Cesarean section, kerr hysterotomy  SURGEON:  Surgeon(s) and Role:    * Rosezetta Balderston, Alanda Slim, MD - Primary  PHYSICIAN ASSISTANT:   ASSISTANTS: none   ANESTHESIA:   epidural and spinal FINDINGS; LIVE female transverse lie, posterior placenta previa, Bilateral peritubal an ovarian adhesions EBL:  1256 ml   BLOOD ADMINISTERED:none  DRAINS: none   LOCAL MEDICATIONS USED:  MARCAINE     SPECIMEN:  Source of Specimen:  placenta  DISPOSITION OF SPECIMEN:  N/A  COUNTS:  YES  TOURNIQUET:  * No tourniquets in log *  DICTATION: .Other Dictation: Dictation Number [id number]  PLAN OF CARE: Admit to inpatient   PATIENT DISPOSITION:  PACU - hemodynamically stable.   Delay start of Pharmacological VTE agent (>24hrs) due to surgical blood loss or risk of bleeding: no

## 2021-11-11 NOTE — Transfer of Care (Signed)
Immediate Anesthesia Transfer of Care Note  Patient: Ebony Valentine  Procedure(s) Performed: CESAREAN SECTION APPLICATION OF CELL SAVER  Patient Location: PACU  Anesthesia Type:Spinal  Level of Consciousness: awake, alert  and oriented  Airway & Oxygen Therapy: Patient Spontanous Breathing  Post-op Assessment: Report given to RN and Post -op Vital signs reviewed and stable  Post vital signs: Reviewed and stable  Last Vitals:  Vitals Value Taken Time  BP    Temp    Pulse 93 11/11/21 1045  Resp 13 11/11/21 1045  SpO2 100 % 11/11/21 1045  Vitals shown include unvalidated device data.  Last Pain:  Vitals:   11/11/21 0629  TempSrc: Oral         Complications: No notable events documented.

## 2021-11-11 NOTE — H&P (Signed)
Ebony Valentine is a 35 y.o. female presenting for repeat C/S @ 37 wk due to placenta previa and previous C/S. Pt has not had any vaginal bleeding. OB History     Gravida  2   Para  1   Term  1   Preterm      AB      Living  1      SAB      IAB      Ectopic      Multiple  0   Live Births  1          Past Medical History:  Diagnosis Date   Anemia    Eczema    Endometriosis    Fibroid    Functional ovarian cysts    Trichomonas infection    Past Surgical History:  Procedure Laterality Date   CESAREAN SECTION N/A 08/22/2018   Procedure: CESAREAN SECTION;  Surgeon: Thurnell Lose, MD;  Location: [facility];  Service: Obstetrics;  Laterality: N/A;   DENTAL SURGERY  2009   wisdom teeth removal    ROBOTIC ASSISTED LAPAROSCOPIC OVARIAN CYSTECTOMY Bilateral 07/11/2016   Procedure: ROBOTIC ASSISTED LAPAROSCOPIC OVARIAN CYSTECTOMY;  Surgeon: Delsa Bern, MD;  Location: [facility];  Service: Gynecology;  Laterality: Bilateral;   Family History: family history includes Asthma in her father. Social History:  reports that she has never smoked. She has never used smokeless tobacco. She reports that she does not currently use alcohol. She reports that she does not use drugs.     Maternal Diabetes: No Genetic Screening: Normal Maternal Ultrasounds/Referrals: Normal Fetal Ultrasounds or other Referrals:  Referred to Materal Fetal Medicine  previa Maternal Substance Abuse:  No Significant Maternal Medications:  None Significant Maternal Lab Results:  Group B Strep positive Other Comments:   placenta previa . BMZ x 1  Review of Systems  All other systems reviewed and are negative. History   currently breastfeeding. Exam Physical Exam Constitutional:      Appearance: Normal appearance.  HENT:     Head: Atraumatic.  Eyes:     Extraocular Movements: Extraocular movements intact.  Cardiovascular:     Rate and Rhythm: Regular rhythm.     Heart sounds: Normal  heart sounds.  Pulmonary:     Breath sounds: Normal breath sounds.  Abdominal:     Palpations: Abdomen is soft.     Comments: Gravid. Pfannenstiel scar  Musculoskeletal:        General: Normal range of motion.     Cervical back: Neck supple.  Skin:    General: Skin is warm and dry.  Neurological:     General: No focal deficit present.     Mental Status: She is alert and oriented to person, place, and time.  Psychiatric:        Mood and Affect: Mood normal.        Behavior: Behavior normal.    Prenatal labs: ABO, Rh: --/--/O POS (12/29 0920) Antibody: NEG (12/29 0920) Rubella: Immune (06/27 0000) RPR: NON REACTIVE (12/29 0915)  HBsAg: Negative (06/27 0000)  HIV: Non-reactive (06/27 0000)  GBS:   positive HCV neg  Assessment/Plan: Placenta Previa Previous C/S  GBS cx positive Covid 19 positive: asx IUP @ 37 wk P) repeat C/S. Procedure explained. Possible risk of placenta accreta disc including risk of cesarean hysterectomy and its consequences. Additionally risk of surgery includes infection, Bleeding, injury to bladder, bowel, ureter, internal scar tissue, possible need for blood transfusion and its risk( HIV,  acute rxn, hepatitis). All ? answered  Salle Brandle A Bishoy Cupp 11/11/2021, 6:25 AM

## 2021-11-11 NOTE — Lactation Note (Signed)
This note was copied from a baby's chart. Lactation Consultation Note  Patient Name: Ebony Valentine [id number] Date: 11/11/2021 Reason for consult: Initial assessment;Early term 37-38.6wks Age:35 hours  P2 mom.  Previous baby was in NICU and had a tongue restriction revised by an ENT.  Mom had low milk supply due to pumping demands in NICU.  Mom states baby BF well.  Hand express reviewed.  Mom states baby is 37 weeks.  Chart states 38 wk.   LC discussed ETI behavior and discussed hand expressing and feeding back any EBM to baby.  Mom does desire to post pump to stimulate milk supply.  LC communicated this to RN for pump set up.  LC assisted with latch by rolling a blanket and positioning under and to the side of mom's breast in order to help support the breast when baby latches.  Infant latched with a wide gape, good rhythmic sucking noted when mom compressed the breast.  No swallows heard.  Encouraged STS, hand expression, and pumping every three hours to stimulate milk supply if baby is sleepy at the breast.    Parents are aware of resources and brochure was discussed.    Maternal Data Has patient been taught Hand Expression?: Yes Does the patient have breastfeeding experience prior to this delivery?: Yes (some pumping in NICU and beyond) How long did the patient breastfeed?: 3 months with pumping and supplementing with formula  Feeding Mother's Current Feeding Choice: Breast Milk  LATCH Score Latch: Grasps breast easily, tongue down, lips flanged, rhythmical sucking.  Audible Swallowing: None  Type of Nipple: Everted at rest and after stimulation  Comfort (Breast/Nipple): Soft / non-tender  Hold (Positioning): Assistance needed to correctly position infant at breast and maintain latch.  LATCH Score: 7   Lactation Tools Discussed/Used    Interventions Interventions: Breast feeding basics reviewed;Assisted with latch;Skin to skin;Hand express;Support  pillows;Adjust position;LC Services brochure  Discharge Pump: Personal (mom has a mom cozy pump, mom desires pump set up)  Consult Status Consult Status: Follow-up Date: 11/12/21 Follow-up type: In-patient    Ferne Coe [facility] 11/11/2021, 3:30 PM

## 2021-11-11 NOTE — Anesthesia Procedure Notes (Addendum)
Spinal  Start time: 11/11/2021 8:59 AM End time: 11/11/2021 9:01 AM Reason for block: surgical anesthesia Staffing Performed: anesthesiologist  Anesthesiologist: Effie Berkshire, MD Preanesthetic Checklist Completed: patient identified, IV checked, site marked, risks and benefits discussed, surgical consent, monitors and equipment checked, pre-op evaluation and timeout performed Spinal Block Patient position: sitting Prep: DuraPrep and site prepped and draped Location: L3-4 Injection technique: single-shot Needle Needle type: Tuohy and Pencan  Needle gauge: 24 G Needle length: 10 cm Needle insertion depth: 10 cm Assessment Events: CSF return Additional Notes Patient tolerated well. No immediate complications.  Functioning IV was confirmed and monitors were applied. Sterile prep and drape, including hand hygiene and sterile gloves were used. The patient was positioned and the back was prepped. The skin was anesthetized with lidocaine. Free flow of clear CSF was obtained prior to injecting local anesthetic into the CSF. The spinal needle aspirated freely following injection. The needle was carefully withdrawn. The patient tolerated the procedure well.;  CSE performed. Epidural placed, not tested.

## 2021-11-11 NOTE — Interval H&P Note (Signed)
History and Physical Interval Note:  11/11/2021 8:31 AM  Bertrum Sol  has presented today for surgery, with the diagnosis of PREVIA and Previous Cesarean section COVID POSITIVE.  The various methods of treatment have been discussed with the patient and family. After consideration of risks, benefits and other options for treatment, the patient has consented to  [place], [place]. CELL SAVER as a surgical intervention.  The patient's history has been reviewed, patient examined, no change in status, stable for surgery.  I have reviewed the patient's chart and labs.  Questions were answered to the patient's satisfaction.     Palma Buster A Marit Goodwill

## 2021-11-12 ENCOUNTER — Encounter (HOSPITAL_COMMUNITY): Payer: Self-pay | Admitting: Obstetrics and Gynecology

## 2021-11-12 LAB — CBC
HCT: 25.4 % — ABNORMAL LOW (ref 36.0–46.0)
Hemoglobin: 8.7 g/dL — ABNORMAL LOW (ref 12.0–15.0)
MCH: 33.3 pg (ref 26.0–34.0)
MCHC: 34.3 g/dL (ref 30.0–36.0)
MCV: 97.3 fL (ref 80.0–100.0)
Platelets: 187 10*3/uL (ref 150–400)
RBC: 2.61 MIL/uL — ABNORMAL LOW (ref 3.87–5.11)
RDW: 14.4 % (ref 11.5–15.5)
WBC: 19.1 10*3/uL — ABNORMAL HIGH (ref 4.0–10.5)
nRBC: 0 % (ref 0.0–0.2)

## 2021-11-12 MED ORDER — IBUPROFEN 800 MG PO TABS
800.0000 mg | ORAL_TABLET | Freq: Four times a day (QID) | ORAL | Status: DC | PRN
  Administered 2021-11-12 – 2021-11-13 (×4): 800 mg via ORAL
  Filled 2021-11-12 (×4): qty 1

## 2021-11-12 MED ORDER — SODIUM CHLORIDE 0.9 % IV SOLN
500.0000 mg | Freq: Once | INTRAVENOUS | Status: AC
  Administered 2021-11-12: 500 mg via INTRAVENOUS
  Filled 2021-11-12: qty 25

## 2021-11-12 MED ORDER — TETANUS-DIPHTH-ACELL PERTUSSIS 5-2.5-18.5 LF-MCG/0.5 IM SUSY
0.5000 mL | PREFILLED_SYRINGE | Freq: Once | INTRAMUSCULAR | Status: AC
  Administered 2021-11-12: 0.5 mL via INTRAMUSCULAR
  Filled 2021-11-12: qty 0.5

## 2021-11-12 NOTE — Progress Notes (Signed)
SVD: repeat  S:  Pt reports feeling well. Denies / Tolerating po/ Voiding without problems/ No n/v/ Bleeding is light/ Pain controlled with oxycodone    O:  A & O x 3 / VS: Blood pressure 108/67, pulse 70, temperature 98.6 F (37 C), temperature source Oral, resp. rate 18, height 5\' 6"  (1.676 m), weight 90.5 kg, SpO2 100 %, currently breastfeeding.  LABS:  Results for orders placed or performed during the hospital encounter of 11/11/21 (from the past 24 hour(s))  Type and screen [place]     Status: None   Collection Time: 11/11/21 12:53 PM  Result Value Ref Range   ABO/RH(D) O POS    Antibody Screen NEG    Sample Expiration      11/14/2021,2359 Performed at [facility], [place] [address], [place], [place] [address]   CBC     Status: Abnormal   Collection Time: 11/11/21 12:53 PM  Result Value Ref Range   WBC 15.7 (H) 4.0 - 10.5 K/uL   RBC 3.02 (L) 3.87 - 5.11 MIL/uL   Hemoglobin 9.9 (L) 12.0 - 15.0 g/dL   HCT 28.9 (L) 36.0 - 46.0 %   MCV 95.7 80.0 - 100.0 fL   MCH 32.8 26.0 - 34.0 pg   MCHC 34.3 30.0 - 36.0 g/dL   RDW 14.1 11.5 - 15.5 %   Platelets 193 150 - 400 K/uL   nRBC 0.0 0.0 - 0.2 %  CBC     Status: Abnormal   Collection Time: 11/12/21  4:15 AM  Result Value Ref Range   WBC 19.1 (H) 4.0 - 10.5 K/uL   RBC 2.61 (L) 3.87 - 5.11 MIL/uL   Hemoglobin 8.7 (L) 12.0 - 15.0 g/dL   HCT 25.4 (L) 36.0 - 46.0 %   MCV 97.3 80.0 - 100.0 fL   MCH 33.3 26.0 - 34.0 pg   MCHC 34.3 30.0 - 36.0 g/dL   RDW 14.4 11.5 - 15.5 %   Platelets 187 150 - 400 K/uL   nRBC 0.0 0.0 - 0.2 %    I&O: I/O last 3 completed shifts: In: 2800 [I.V.:2700; IV Piggyback:100] Out: 0300 [Urine:3275; Blood:1257]   Total I/O In: -  Out: 700 [Urine:700]  Lungs: chest clear, no wheezing, rales, normal symmetric air entry  Heart: regular rate and rhythm, S1, S2 normal, no murmur, click, rub or gallop  Abdomen: uterus firm at umb  Primary dressing d/c/i  Perineum: not  inspected  Lochia: light  Extremities:no redness or tenderness in the calves or thighs, no edema    A/P: POD # 1/PPD # 1/ G2P2002 IDA exacerbated  by acute blood loss( previously on iron supplement during preg) Covid 19 positive. asx  Doing well  Continue routine post partum orders  IV iron infusion  Anticipate d/c in  am

## 2021-11-12 NOTE — Lactation Note (Signed)
This note was copied from a baby's chart. Lactation Consultation Note  Patient Name: Ebony Valentine [id number] Date: 11/12/2021 Reason for consult: Follow-up assessment;Early term 37-38.6wks;Infant weight loss;Breastfeeding assistance;Other (Comment) (4 % weight loss/) Age:36 hours Baby resting on dads chest asleep. When dad asked the [place] to place baby in the  Crib, baby woke up rooting . LC assisted mom to latch on the right breast cross cradle and baby still feeding at 14 mins with swallows. Per mom comfortable.  LC recommended due to areola edema and dry skin to use a dab of coconut oil to soften the areola and make it more compressible for a dee[er latch.  Another recommendation prior to latch - breast massage, hand express, prepump and reverse pressure. Latch with firm support.  Latch score 9   Maternal Data    Feeding Mother's Current Feeding Choice: Breast Milk  LATCH Score Latch: Grasps breast easily, tongue down, lips flanged, rhythmical sucking.  Audible Swallowing: Spontaneous and intermittent  Type of Nipple: Everted at rest and after stimulation  Comfort (Breast/Nipple): Soft / non-tender  Hold (Positioning): Assistance needed to correctly position infant at breast and maintain latch.  LATCH Score: 9   Lactation Tools Discussed/Used Tools: Pump;Flanges (DEBP already set up) Flange Size: 27;24 Breast pump type: Double-Electric Breast Pump Pump Education: Milk Storage Pumping frequency: permom x 1  Interventions Interventions: Breast feeding basics reviewed;Assisted with latch;Skin to skin;Breast massage;Breast compression;Adjust position;Support pillows;Position options;DEBP;Education  Discharge Pump: Personal  Consult Status Consult Status: Follow-up Date: 11/13/21 Follow-up type: In-patient    [place] 11/12/2021, 3:22 PM

## 2021-11-13 ENCOUNTER — Encounter (HOSPITAL_COMMUNITY): Payer: Self-pay | Admitting: Obstetrics and Gynecology

## 2021-11-13 MED ORDER — OXYCODONE HCL 5 MG PO TABS: 5.0000 mg | ORAL_TABLET | ORAL | 0 refills | Status: AC | PRN

## 2021-11-13 MED ORDER — IBUPROFEN 800 MG PO TABS: 800.0000 mg | ORAL_TABLET | Freq: Four times a day (QID) | ORAL | 11 refills | Status: AC | PRN

## 2021-11-13 NOTE — Discharge Instructions (Signed)
Call if temperature greater than equal to 100.4, nothing per vagina for 4-6 weeks or severe nausea vomiting, increased incisional pain , drainage or redness in the incision site, no straining with bowel movements, showers no bath °

## 2021-11-13 NOTE — Lactation Note (Signed)
This note was copied from a baby's chart. Lactation Consultation Note  Patient Name: Ebony Valentine [id number] Date: 11/13/2021 Reason for consult: Follow-up assessment Age:36 hours As LC entered the room baby latched on the left breast without pillow support.  Lc added the pillow support / increased depth and more swallows noted.  Baby fed 25 mins with multiple swallows / increased with breast compressions .  Released and mom latched the baby on the right breast with depth / and pillow support / per mom comfortable.  Per mom breast feeding is going well.  LC reassured mom with all those swallows , her milk is coming in .  [place] reviewed signs of hunger and signs of being full for the baby and watch for non - nutritive feeding patterns ( hanging out latched)  LC reviewed BF D/C teaching .  Mom aware of the BF D/C resources.   Maternal Data    Feeding Mother's Current Feeding Choice: Breast Milk  LATCH Score Latch: Grasps breast easily, tongue down, lips flanged, rhythmical sucking.  Audible Swallowing: Spontaneous and intermittent  Type of Nipple: Everted at rest and after stimulation  Comfort (Breast/Nipple): Filling, red/small blisters or bruises, mild/mod discomfort  Hold (Positioning): Assistance needed to correctly position infant at breast and maintain latch.  LATCH Score: 8   Lactation Tools Discussed/Used Pump Education: Milk Storage Reason for Pumping: PRN  Interventions Interventions: Breast feeding basics reviewed;Assisted with latch;Skin to skin;Hand express;Breast compression;Adjust position;Support pillows;Position options  Discharge Discharge Education: Engorgement and breast care;Warning signs for feeding baby Pump: Personal;Manual;DEBP  Consult Status Consult Status: Complete Date: 11/13/21    Jerlyn Ly Donzel Romack 11/13/2021, 11:06 AM

## 2021-11-13 NOTE — Discharge Summary (Signed)
Postpartum Discharge Summary  Date of Service updated     Patient Name: Ebony Valentine DOB: 1986/01/31 MRN: [id number]  Date of admission: 11/11/2021 Delivery date:11/11/2021  Delivering provider: Leondro Coryell  Date of discharge: 11/13/2021  Admitting diagnosis: Placenta previa, previous cesarean section Intrauterine pregnancy: [redacted]w[redacted]d    Secondary diagnosis:  Covid 19 positive, IUP @ 37 wk Additional problems: GBS cx positive    Discharge diagnosis: Preterm Pregnancy Delivered, Anemia, and GBS cx positive, Covid 19 positive                                               Post partum procedures: IV iron infusion Augmentation: N/A Complications: [id number] Hospital course: Sceduled C/S   36y.o. yo G2P2002 at 36w0das admitted to the hospital 11/11/2021 for scheduled cesarean section with the following indication: placenta previa and previous C/S .Delivery details are as follows:  Membrane Rupture Time/Date: 9:32 AM ,11/11/2021   Delivery Method:C-Section, Low Transverse  Details of operation can be found in separate operative note.  Patient had an uncomplicated postpartum course.  She is ambulating, tolerating a regular diet, passing flatus, and urinating well. Patient is discharged home in stable condition on  11/13/2021        Newborn Data: Birth date:11/11/2021  Birth time:9:32 AM  Gender:Female  Living status:Living  Apgars:9 ,9  Weight:3.03 kg     Magnesium Sulfate received: No BMZ received: No Rhophylac:N/A MMR:No T-DaP:Given postpartum Flu: No Transfusion:No  Physical exam  Vitals:   11/12/21 0615 11/12/21 1555 11/12/21 2054 11/13/21 0553  BP: 108/67 115/64 120/76 114/61  Pulse: 70 71 82 74  Resp: _0 Temp: 98.6 F (37 C) 98.8 F (37.1 C) 98.5 F (36.9 C) 98.3 F (36.8 C)  TempSrc: Oral Oral Oral Oral  SpO2:  100% 100% 99%  Weight:      Height:       General: alert, cooperative, and no distress Lochia: appropriate Uterine  Fundus: firm Incision: Dressing is clean, dry, and intact DVT Evaluation: No evidence of DVT seen on physical exam. No significant calf/ankle edema. Labs: Lab Results  Component Value Date   WBC 19.1 (H) 11/12/2021   HGB 8.7 (L) 11/12/2021   HCT 25.4 (L) 11/12/2021   MCV 97.3 11/12/2021   PLT 187 11/12/2021   CMP Latest Ref Rng & Units 03/24/2018  Glucose 65 - 99 mg/dL 93  BUN 6 - 20 mg/dL 8  Creatinine 0.44 - 1.00 mg/dL 0.63  Sodium 135 - 145 mmol/L 137  Potassium 3.5 - 5.1 mmol/L 4.3  Chloride 101 - 111 mmol/L 105  CO2 22 - 32 mmol/L 24  Calcium 8.9 - 10.3 mg/dL 9.4  Total Protein 6.5 - 8.1 g/dL -  Total Bilirubin 0.3 - 1.2 mg/dL -  Alkaline Phos 38 - 126 U/L -  AST 15 - 41 U/L -  ALT 14 - 54 U/L -   Edinburgh Score: Edinburgh Postnatal Depression Scale Screening Tool 11/12/2021  I have been able to laugh and see the funny side of things. 0  I have looked forward with enjoyment to things. 0  I have blamed myself unnecessarily when things went wrong. 1  I have been anxious or worried for no good reason. 1  I have felt scared or panicky for no good reason. 0  Things have been  getting on top of me. 0  I have been so unhappy that I have had difficulty sleeping. 0  I have felt sad or miserable. 0  I have been so unhappy that I have been crying. 1  The thought of harming myself has occurred to me. 0  Edinburgh Postnatal Depression Scale Total 3      After visit meds:  Allergies as of 11/13/2021       Reactions   Shellfish Allergy Anaphylaxis        Medication List     STOP taking these medications    doxylamine (Sleep) 25 MG tablet Commonly known as: UNISOM       TAKE these medications    clobetasol cream 0.05 % Commonly known as: TEMOVATE Apply 1 application topically 2 (two) times daily as needed (eczema).   diphenhydrAMINE 25 MG tablet Commonly known as: BENADRYL Take 25-50 mg by mouth daily as needed for allergies.   ferrous sulfate 325 (65 FE) MG  tablet Take 650 mg by mouth daily.   ibuprofen 800 MG tablet Commonly known as: ADVIL Take 1 tablet (800 mg total) by mouth every 6 (six) hours as needed for moderate pain.   levocetirizine 5 MG tablet Commonly known as: XYZAL Take 5 mg by mouth daily as needed for allergies.   oxyCODONE 5 MG immediate release tablet Commonly known as: Oxy IR/ROXICODONE Take 1-2 tablets (5-10 mg total) by mouth every 4 (four) hours as needed for up to 7 days for moderate pain.   prenatal multivitamin Tabs tablet Take 1 tablet by mouth at bedtime.   triamcinolone lotion 0.1 % Commonly known as: KENALOG Apply 1 application topically 2 (two) times daily as needed (eczema).   Vitamin C 500 MG Chew Chew 500 mg by mouth daily.         Discharge home in stable condition Infant Feeding: Breast Infant Disposition:home with mother Discharge instruction: per After Visit Summary and Postpartum booklet. Activity: Advance as tolerated. Pelvic rest for 6 weeks.  Diet: iron rich diet Anticipated Birth Control: Unsure Postpartum Appointment:6 weeks Additional Postpartum F/U:  n/a Future Appointments:No future appointments. Follow up Visit:  Follow-up Information     Servando Salina, MD Follow up in 6 week(s).   Specialty: Obstetrics and Gynecology Contact information: [address] [place] [address] [phone]                     11/13/2021 Marvene Staff, MD

## 2021-11-16 MED FILL — Heparin Sodium (Porcine) Inj 1000 Unit/ML: INTRAMUSCULAR | Qty: 30 | Status: AC

## 2021-11-16 MED FILL — Sodium Chloride IV Soln 0.9%: INTRAVENOUS | Qty: 1000 | Status: AC

## 2021-11-25 ENCOUNTER — Telehealth (HOSPITAL_COMMUNITY): Payer: Self-pay

## 2021-11-25 ENCOUNTER — Inpatient Hospital Stay (HOSPITAL_COMMUNITY): Admit: 2021-11-25 | Payer: Self-pay

## 2021-11-25 NOTE — Telephone Encounter (Signed)
No answer. Left message to return nurse call.  Sharyn Lull [facility] 11/25/2021,0916

## 2022-04-20 NOTE — Congregational Nurse Program (Signed)
  Dept: [phone]   Congregational Nurse Program Note  Date of Encounter: 04/20/2022  Past Medical History: Past Medical History:  Diagnosis Date   Anemia    Eczema    Endometriosis    Fibroid    Functional ovarian cysts    Trichomonas infection     Encounter Details:  CNP Questionnaire - 04/20/22 1534       Questionnaire   Do you give verbal consent to treat you today? Yes    Location Patient [place]   Visit Setting Church or Organization    Patient Status Unknown    Sport and exercise psychologist or [place] Referral N/A    Medication N/A    Medical Provider Yes    Screening Referrals N/A    Medical Referral N/A    Medical Appointment Made N/A    Food N/A    Transportation N/A    Housing/Utilities N/A    Interpersonal Safety N/A    Intervention N/A    ED Visit Averted N/A    Life-Saving Intervention Made N/A              Today's Vitals   04/18/22 1500  Pulse: 90  Resp: 16  SpO2: 99%  Weight: 186 lb (84.4 kg)   Body mass index is 30.02 kg/m.  Patient concerned about weight management. Patient was recently lost three pounds. Patient was given education on healthy eating and exercise. Patient performed chair exercise with visiting with nurse. Patient stated she has some concerns related to recent lab results. Reviewed with patient ways to help lower cholesterol. Patient has recently has some lifestyle changes, she is exercising several times a week. Patient verbalized understanding to all education and spiritual care given.   Andrez Grime, BSN, RN, CRRN,CMSRN
